# Patient Record
Sex: Female | Born: 1965 | Race: Black or African American | Hispanic: No | Marital: Single | Smoking: Never smoker
Health system: Southern US, Community
[De-identification: ages and names within clinical notes are randomized; demographics above are authoritative.]

## PROBLEM LIST (undated history)

## (undated) DIAGNOSIS — M797 Fibromyalgia: Secondary | ICD-10-CM

## (undated) DIAGNOSIS — K649 Unspecified hemorrhoids: Secondary | ICD-10-CM

## (undated) DIAGNOSIS — M1990 Unspecified osteoarthritis, unspecified site: Secondary | ICD-10-CM

## (undated) DIAGNOSIS — D649 Anemia, unspecified: Secondary | ICD-10-CM

## (undated) DIAGNOSIS — E669 Obesity, unspecified: Secondary | ICD-10-CM

## (undated) DIAGNOSIS — F32A Depression, unspecified: Secondary | ICD-10-CM

## (undated) DIAGNOSIS — K760 Fatty (change of) liver, not elsewhere classified: Secondary | ICD-10-CM

## (undated) DIAGNOSIS — G43009 Migraine without aura, not intractable, without status migrainosus: Secondary | ICD-10-CM

## (undated) DIAGNOSIS — F410 Panic disorder [episodic paroxysmal anxiety] without agoraphobia: Secondary | ICD-10-CM

## (undated) DIAGNOSIS — I8390 Asymptomatic varicose veins of unspecified lower extremity: Secondary | ICD-10-CM

## (undated) DIAGNOSIS — M7711 Lateral epicondylitis, right elbow: Secondary | ICD-10-CM

## (undated) DIAGNOSIS — J309 Allergic rhinitis, unspecified: Secondary | ICD-10-CM

## (undated) DIAGNOSIS — F419 Anxiety disorder, unspecified: Secondary | ICD-10-CM

## (undated) DIAGNOSIS — I82409 Acute embolism and thrombosis of unspecified deep veins of unspecified lower extremity: Secondary | ICD-10-CM

## (undated) DIAGNOSIS — K219 Gastro-esophageal reflux disease without esophagitis: Secondary | ICD-10-CM

## (undated) DIAGNOSIS — F329 Major depressive disorder, single episode, unspecified: Secondary | ICD-10-CM

## (undated) HISTORY — PX: OTHER SURGICAL HISTORY: SHX169

## (undated) HISTORY — PX: ABDOMINAL HYSTERECTOMY: SHX81

## (undated) HISTORY — DX: Anemia, unspecified: D64.9

## (undated) HISTORY — DX: Unspecified osteoarthritis, unspecified site: M19.90

## (undated) HISTORY — DX: Unspecified hemorrhoids: K64.9

## (undated) HISTORY — DX: Lateral epicondylitis, right elbow: M77.11

## (undated) HISTORY — PX: BREAST EXCISIONAL BIOPSY: SUR124

## (undated) HISTORY — DX: Obesity, unspecified: E66.9

## (undated) HISTORY — DX: Migraine without aura, not intractable, without status migrainosus: G43.009

## (undated) HISTORY — DX: Allergic rhinitis, unspecified: J30.9

## (undated) HISTORY — DX: Fatty (change of) liver, not elsewhere classified: K76.0

## (undated) HISTORY — DX: Acute embolism and thrombosis of unspecified deep veins of unspecified lower extremity: I82.409

## (undated) HISTORY — DX: Asymptomatic varicose veins of unspecified lower extremity: I83.90

## (undated) HISTORY — DX: Panic disorder (episodic paroxysmal anxiety): F41.0

---

## 2004-12-22 ENCOUNTER — Other Ambulatory Visit: Admission: RE | Admit: 2004-12-22 | Discharge: 2004-12-22 | Payer: Self-pay | Admitting: Obstetrics and Gynecology

## 2005-09-19 ENCOUNTER — Inpatient Hospital Stay (HOSPITAL_COMMUNITY): Admission: RE | Admit: 2005-09-19 | Discharge: 2005-09-21 | Payer: Self-pay | Admitting: Obstetrics and Gynecology

## 2005-09-19 ENCOUNTER — Encounter (INDEPENDENT_AMBULATORY_CARE_PROVIDER_SITE_OTHER): Payer: Self-pay | Admitting: Specialist

## 2007-05-14 ENCOUNTER — Other Ambulatory Visit: Admission: RE | Admit: 2007-05-14 | Discharge: 2007-05-14 | Payer: Self-pay | Admitting: Internal Medicine

## 2007-08-20 ENCOUNTER — Ambulatory Visit (HOSPITAL_COMMUNITY): Admission: RE | Admit: 2007-08-20 | Discharge: 2007-08-20 | Payer: Self-pay | Admitting: *Deleted

## 2008-08-26 ENCOUNTER — Encounter: Admission: RE | Admit: 2008-08-26 | Discharge: 2008-11-24 | Payer: Self-pay | Admitting: Unknown Physician Specialty

## 2008-10-13 ENCOUNTER — Ambulatory Visit (HOSPITAL_COMMUNITY): Admission: RE | Admit: 2008-10-13 | Discharge: 2008-10-13 | Payer: Self-pay | Admitting: Sports Medicine

## 2008-12-09 ENCOUNTER — Ambulatory Visit (HOSPITAL_COMMUNITY): Admission: RE | Admit: 2008-12-09 | Discharge: 2008-12-09 | Payer: Self-pay | Admitting: *Deleted

## 2009-11-09 ENCOUNTER — Encounter (INDEPENDENT_AMBULATORY_CARE_PROVIDER_SITE_OTHER): Payer: Self-pay | Admitting: Obstetrics and Gynecology

## 2009-11-09 ENCOUNTER — Ambulatory Visit (HOSPITAL_COMMUNITY): Admission: RE | Admit: 2009-11-09 | Discharge: 2009-11-10 | Payer: Self-pay | Admitting: Obstetrics and Gynecology

## 2010-07-25 ENCOUNTER — Encounter: Payer: Self-pay | Admitting: Internal Medicine

## 2010-09-21 LAB — HCG, SERUM, QUALITATIVE: Preg, Serum: NEGATIVE

## 2010-09-21 LAB — COMPREHENSIVE METABOLIC PANEL
Albumin: 4 g/dL (ref 3.5–5.2)
Alkaline Phosphatase: 50 U/L (ref 39–117)
BUN: 13 mg/dL (ref 6–23)
Chloride: 107 mEq/L (ref 96–112)
Creatinine, Ser: 1.03 mg/dL (ref 0.4–1.2)
Glucose, Bld: 78 mg/dL (ref 70–99)
Potassium: 4 mEq/L (ref 3.5–5.1)
Total Bilirubin: 0.4 mg/dL (ref 0.3–1.2)

## 2010-09-21 LAB — CBC
HCT: 37.5 % (ref 36.0–46.0)
Hemoglobin: 13 g/dL (ref 12.0–15.0)
MCV: 94.2 fL (ref 78.0–100.0)
Platelets: 163 10*3/uL (ref 150–400)
Platelets: 239 10*3/uL (ref 150–400)
RBC: 2.8 MIL/uL — ABNORMAL LOW (ref 3.87–5.11)
RDW: 14.8 % (ref 11.5–15.5)
WBC: 12.7 10*3/uL — ABNORMAL HIGH (ref 4.0–10.5)
WBC: 6.4 10*3/uL (ref 4.0–10.5)

## 2010-11-16 NOTE — Op Note (Signed)
Jennifer Henry, Jennifer Henry             ACCOUNT NO.:  [id number]   MEDICAL RECORD NO.:  [id number]          PATIENT TYPE:  AMB   LOCATION:  ENDO                         FACILITY:  [facility]   PHYSICIAN:  Georgiana Spinner, M.D.    DATE OF BIRTH:  [birth year]   DATE OF PROCEDURE:  08/20/2007  DATE OF DISCHARGE:                               OPERATIVE REPORT   PROCEDURE:  Upper endoscopy.   ENDOSCOPIST:  Georgiana Spinner, M.D.   INDICATIONS:  GERD.   ANESTHESIA:  Fentanyl 60 mcg, Versed 6 mg.   PROCEDURE:  With the patient mildly sedated in the left lateral  decubitus position, the Pentax videoscopic endoscope was inserted in the  mouth and passed under direct vision through the esophagus, which  appeared normal, into the stomach.  Fundus, body, antrum, duodenal bulb  and second portion of duodenum all appeared normal.  From this point,  the endoscope was slowly withdrawn, taking circumferential views of  duodenal mucosa until the endoscope had been pulled back into stomach  and placed in retroflexion to view the stomach from below.  The  endoscope was straightened and withdrawn, taking circumferential views  of the remaining gastric and esophageal mucosa.  The patient's vital  signs and pulse oximetry remained stable.  The patient tolerated the  procedure well without apparent complications.   FINDINGS:  Negative examination.   PLAN:  Have patient follow up with me as an outpatient to discuss her  reflux symptomatology further.           ______________________________  Georgiana Spinner, M.D.     GMO/MEDQ  D:  08/20/2007  T:  08/21/2007  Job:  [id number]

## 2010-11-16 NOTE — Op Note (Signed)
Jennifer Henry, KARPEL             ACCOUNT NO.:  [id number]   MEDICAL RECORD NO.:  [id number]          PATIENT TYPE:  AMB   LOCATION:  ENDO                         FACILITY:  [facility]   PHYSICIAN:  Georgiana Spinner, M.D.    DATE OF BIRTH:  05/12/1966   DATE OF PROCEDURE:  DATE OF DISCHARGE:                               OPERATIVE REPORT   PROCEDURE:  Colonoscopy.   INDICATIONS:  Rectal bleeding.   ANESTHESIA:  Fentanyl 100 mcg, Versed 7 mg.   PROCEDURE:  With the patient mildly sedated in the left lateral  decubitus position, the Pentax videoscopic pediatric colonoscope was  inserted in the rectum and passed under direct vision with pressure  applied.  The patient turned to her back and we were finally able to see  the cecum, identified by ileocecal valve and appendiceal orifice, both  of which were photographed.  It was a very tortuous colon.  From this  point, the colonoscope was slowly withdrawn taking circumferential views  of the colonic mucosa stopping only in the rectum which appeared normal  on direct and showed hemorrhoids on retroflexed view.  The endoscope was  straightened and withdrawn.  The patient's vital signs and pulse  oximeter remained stable.  The patient tolerated the procedure well  without apparent complications.   FINDINGS:  Negative rectal examination and perineal inspection.  Internal hemorrhoids were noted.  Otherwise, an unremarkable  colonoscopic examination to the cecum.   PLAN:  Have patient to follow-up with me on as-needed basis.           ______________________________  Georgiana Spinner, M.D.     GMO/MEDQ  D:  12/09/2008  T:  12/09/2008  Job:  [id number]

## 2010-11-19 NOTE — H&P (Signed)
Jennifer Henry, Jennifer Henry             ACCOUNT NO.:  [id number]   MEDICAL RECORD NO.:  [id number]          PATIENT TYPE:  INP   LOCATION:  NA                            FACILITY:  [facility]   PHYSICIAN:  Guy Sandifer. Henderson Cloud, M.D. DATE OF BIRTH:  1966/05/22   DATE OF ADMISSION:  DATE OF DISCHARGE:                                HISTORY & PHYSICAL   DATE OF ADMISSION:  Patient scheduled for admission on September 19, 2005.   CHIEF COMPLAINT:  Uterine fibroids.   HISTORY OF PRESENT ILLNESS:  This patient is a 45 year old single African-  American female G1, P1 who has increasingly heavy menstrual flows, changing  a large pad every two to three hours for two days out of the month.  She has  known uterine leiomyomata.  Ultrasound on July 18, 2005 reveals growth of  her leiomyomata with the largest one measuring 7 cm followed by several  others ranging from 4.7 to 1.3 cm.  After discussion of the options she is  being admitted for uterine myomectomy.  Potential risks and complications as  well as potential impacts upon fertility and labor and delivery have been  discussed preoperatively.   PAST MEDICAL HISTORY:  Negative.   PAST SURGICAL HISTORY:  Benign right breast biopsy.   FAMILY HISTORY:  Diabetes in mother, maternal grandmother, father.  Brain  cancer in sister.  Chronic hypertension in mother, father and both sets of  grandparents.  Heart disease in paternal grandmother, congestive heart  failure in maternal grandmother, seizure disorder in sister.   MEDICATIONS:  1.  Lo-Estrin once a day.  2.  Multivitamin once a day.   ALLERGIES:  No known drug allergies.   SOCIAL HISTORY:  Alcohol on a social basis. Denies tobacco or drug abuse.   REVIEW OF SYSTEMS:  NEUROLOGICAL:  Denies headache.  CORONARY:  Denies chest  pain.  PULMONARY:  Denies shortness of breath. GASTROINTESTINAL:  Denies  recent changes in bowel habits.   PHYSICAL EXAMINATION:  VITAL SIGNS:  Height 5 feet 6 inches, weight  163  pounds.  Blood pressure 110/70.  HEENT:  Without thyromegaly.  LUNGS:  Clear to auscultation.  HEART:  Regular rate and rhythm.  BACK:  Without costovertebral angle tenderness.  BREASTS:  Without masses, retraction, discharge.  ABDOMEN:  Soft, nontender without palpable masses.  PELVIC:  Vulva, vagina, cervix without lesion.  Uterus has a 6 to 8 cm  fibroid mass effect, irregular in contour.  Adnexal examination compromised.  EXTREMITIES:  Grossly within normal limits.  NEUROLOGICAL:  Grossly within normal limits.   ASSESSMENT:  Enlarging uterine leiomyomata.   PLAN:  Uterine myomectomy.      Guy Sandifer Henderson Cloud, M.D.  Electronically Signed     JET/MEDQ  D:  09/16/2005  T:  09/16/2005  Job:  [id number]

## 2010-11-19 NOTE — Discharge Summary (Signed)
Jennifer Henry, Jennifer Henry             ACCOUNT NO.:  [id number]   MEDICAL RECORD NO.:  [id number]          PATIENT TYPE:  INP   LOCATION:  9320                          FACILITY:  [facility]   PHYSICIAN:  Guy Sandifer. Henderson Cloud, M.D. DATE OF BIRTH:  [birth year]   DATE OF ADMISSION:  09/19/2005  DATE OF DISCHARGE:  09/21/2005                                 DISCHARGE SUMMARY   ADMITTING DIAGNOSES:  Uterine leiomyomata.   DISCHARGE DIAGNOSIS:  Uterine leiomyomata.   PROCEDURE:  On September 19, 2005, a uterine myomectomy.   REASON FOR ADMISSION:  This patient is a 45 year old single African-American  female, G1, P1 with increasing heavy menstrual flows and an enlarging  uterine leiomyomata.  Details are dictated in history and physical.  She is  admitted for surgical management.   HOSPITAL COURSE:  The patient is taken to the operating room and undergoes  the above procedure with an estimated blood loss of 400 mL.  On the evening  of surgery, she has some mild nausea, but has stable vital signs and is  afebrile.  Urine output is clear.  On the first postoperative day, she has  not yet passed flatus, but is ambulating.  She has some right shoulder pain  from some intra-abdominal gas.  Vital signs are stable.  She is afebrile and  hemoglobin is 9.5.  On the discharge,, she is passing flatus, tolerating a  regular diet and ambulating well.  Pathology is pending.   CONDITION ON DISCHARGE:  Good.   DIET:  Regular, as tolerated.   ACTIVITY:  No lifting, no operation of automobiles, no vaginal entry and she  is to call the office for problems including, but not limited to,  temperature of 101 degrees, heavy vaginal bleeding, persistent nausea or  vomiting, or increasing pain.   MEDICATIONS:  1.  Percocet 5/325 mg #40, 1-2 p.o. q.6h. p.r.n.  2.  Ibuprofen 800 mg q.8h. p.r.n.  3.  Chromagen #30, 1 p.o. daily and 1 refill.  4.  Multivitamin daily.   FOLLOWUP:  In the office in 2 weeks.     Guy Sandifer  Henderson Cloud, M.D.  Electronically Signed    JET/MEDQ  D:  09/21/2005  T:  09/21/2005  Job:  [id number]

## 2010-11-19 NOTE — Op Note (Signed)
NAMEALIS, SAWCHUK             ACCOUNT NO.:  [id number]   MEDICAL RECORD NO.:  [id number]          PATIENT TYPE:  INP   LOCATION:  9399                          FACILITY:  [facility]   PHYSICIAN:  Guy Sandifer. Henderson Cloud, M.D. DATE OF BIRTH:  January 11, 1966   DATE OF PROCEDURE:  09/19/2005  DATE OF DISCHARGE:                                 OPERATIVE REPORT   PREOPERATIVE DIAGNOSIS:  Uterine leiomyomata.   POSTOPERATIVE DIAGNOSIS:  Uterine leiomyomata.   PROCEDURE:  Uterine myomectomy.   SURGEON:  Harold Hedge, M.D.   ASSISTANT:  Zelphia Cairo, M.D.   ANESTHESIA:  General endotracheal intubation.   SPECIMENS:  Uterine leiomyomata.   ESTIMATED BLOOD LOSS:  400 mL.   INDICATIONS:  The patient is a 45 year old single African-American female,  gravida 1, para 1 with increasingly heavy menstrual flows and enlarging  uterine leiomyomata. Details are dictated in history and physical. After  discussing options of management, uterine myomectomy is discussed. Potential  risks and complications have been reviewed preoperatively including but not  limited to infection, bowel, bladder, ureteral damage, bleeding requiring  transfusion of blood products with possible transfusion reaction, HIV,  hepatitis acquisition, DVT, PE, pneumonia. The possibility of hysterectomy,  infertility secondary to tubal obstruction and/or tubal scarring and the  necessity for cesarean section for delivery after myomectomy as well as the  recurrence of uterine leiomyomata has also been discussed out. All questions  were answered, consent signed on the chart.   UTERINE FINDINGS:  Tubes and ovaries normal bilaterally. Uterus has a  approximately 8 cm fibroid in the lower anterior uterine segment. There is  also approximately 4 cm fibroid in the right fundus as well as several other  smaller fibroids.   PROCEDURE:  The patient is taken to operating room where she is identified,  placed in dorsosupine position and  general anesthesia is induced via  endotracheal intubation. She is then placed in dorsal lithotomy position  where she is prepped abdominally and vaginally. Foley catheter was placed in  the bladder as a drain and she is draped in sterile fashion. Pfannenstiel  incision was made and dissection is carried out in layers to the peritoneum.  Peritoneum was incised, extended superiorly and inferiorly. Lenox Ahr retractor was placed. The bowel was packed away and the upper  blade was placed. Bladder was also retracted with a bladder blade. The  various fibroids were identified. The serosa was incised with the cautery  and they are freed with sharp and blunt dissection. The defects were closed  with 0 Monocryl pops. The fibroid in the right fundus is in proximity to the  course of the fallopian tube although not directly underneath it. The larger  fibroid in the lower uterine segment occupies essentially the entire depth  of the uterine wall. After removing all the fibroids, the defects were  closed. Good hemostasis was noted. Copious irrigation was carried out and  good hemostasis was again noted and the cavity is clean. The packs and  superior retraction blade is removed. Intercede was then laid over top of  the sites of myomectomies. O'Connor-O'Sullivan retractor  was then removed.  The anterior peritoneum was closed running fashion with 0 Monocryl suture  which was also used to reapproximate the pyramidalis muscle in the midline.  Anterior rectus fascia is closed in running fashion with 0 PDS suture and  the skin was closed with clips. All sponge, instrument and needle counts  were correct and the patient is transferred to recovery room in stable  condition.      Guy Sandifer Henderson Cloud, M.D.  Electronically Signed     JET/MEDQ  D:  09/19/2005  T:  09/20/2005  Job:  [id number]

## 2011-05-23 DIAGNOSIS — L669 Cicatricial alopecia, unspecified: Secondary | ICD-10-CM | POA: Insufficient documentation

## 2012-03-20 DIAGNOSIS — IMO0001 Reserved for inherently not codable concepts without codable children: Secondary | ICD-10-CM | POA: Insufficient documentation

## 2012-03-20 DIAGNOSIS — G4710 Hypersomnia, unspecified: Secondary | ICD-10-CM | POA: Insufficient documentation

## 2012-03-20 DIAGNOSIS — R209 Unspecified disturbances of skin sensation: Secondary | ICD-10-CM | POA: Insufficient documentation

## 2012-03-20 DIAGNOSIS — R6889 Other general symptoms and signs: Secondary | ICD-10-CM | POA: Insufficient documentation

## 2012-03-26 ENCOUNTER — Other Ambulatory Visit: Payer: Self-pay | Admitting: Obstetrics and Gynecology

## 2012-03-26 DIAGNOSIS — R928 Other abnormal and inconclusive findings on diagnostic imaging of breast: Secondary | ICD-10-CM

## 2012-03-27 ENCOUNTER — Ambulatory Visit
Admission: RE | Admit: 2012-03-27 | Discharge: 2012-03-27 | Disposition: A | Discharge status: Home / Self Care | Payer: Managed Care, Other (non HMO) | Source: Ambulatory Visit | Attending: Obstetrics and Gynecology | Admitting: Obstetrics and Gynecology

## 2012-03-27 ENCOUNTER — Other Ambulatory Visit: Payer: Self-pay

## 2012-03-27 DIAGNOSIS — R928 Other abnormal and inconclusive findings on diagnostic imaging of breast: Secondary | ICD-10-CM

## 2012-04-05 ENCOUNTER — Other Ambulatory Visit: Payer: Self-pay | Admitting: Gastroenterology

## 2012-04-05 DIAGNOSIS — R1032 Left lower quadrant pain: Secondary | ICD-10-CM

## 2012-04-06 ENCOUNTER — Ambulatory Visit
Admission: RE | Admit: 2012-04-06 | Discharge: 2012-04-06 | Disposition: A | Discharge status: Home / Self Care | Payer: Managed Care, Other (non HMO) | Source: Ambulatory Visit | Attending: Gastroenterology | Admitting: Gastroenterology

## 2012-04-06 DIAGNOSIS — R1032 Left lower quadrant pain: Secondary | ICD-10-CM

## 2012-04-06 MED ORDER — IOHEXOL 300 MG/ML  SOLN
100.0000 mL | Freq: Once | INTRAMUSCULAR | Status: AC | PRN
  Administered 2012-04-06: 100 mL via INTRAVENOUS

## 2012-04-13 ENCOUNTER — Other Ambulatory Visit: Payer: Managed Care, Other (non HMO)

## 2012-04-23 ENCOUNTER — Encounter: Payer: Self-pay | Admitting: *Deleted

## 2012-04-23 ENCOUNTER — Encounter: Payer: Managed Care, Other (non HMO) | Attending: Internal Medicine | Admitting: *Deleted

## 2012-04-23 DIAGNOSIS — E669 Obesity, unspecified: Secondary | ICD-10-CM | POA: Insufficient documentation

## 2012-04-23 DIAGNOSIS — Z713 Dietary counseling and surveillance: Secondary | ICD-10-CM | POA: Insufficient documentation

## 2012-04-23 DIAGNOSIS — E785 Hyperlipidemia, unspecified: Secondary | ICD-10-CM | POA: Insufficient documentation

## 2012-04-23 NOTE — Progress Notes (Signed)
  Medical Nutrition Therapy:  Appt start time: 0730 end time:  0830.  Assessment:  Primary concerns today: patient here for obesity. She states she has fibromyalgia and cannot exercise at gym. 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 She lives alone prepares her own meals or eats at [employer] or [employer] usually.    MEDICATIONS: see list   DIETARY INTAKE:  Usual eating pattern includes 3 meals and 3 snacks per day.  Everyday foods include good variety of all food groups except milk due to lactose intolerance.  Avoided foods include caffeine containing beverages due to GERD  24-hr recall:  B ( AM): flavored apple cinnamon oatmeal, [employer] white chocolate mocha coffee twice a week, (cut back from daily) Snk ( AM): fresh fruit and water after coffee is gone  L ( PM): bring from home; [place] wrap OR salad with meat added with balsamic vinaigrette dressing OR eat out with chopped Cobb salad or full baked potato Snk ( PM): occasionally M&M's with peanuts from vending machine OR Nacho chips D ( PM): more fish lately, vegetables x 2-3, occasionally starch OR  [employer]; Strips with waffle fries or [employer] Cobb salad,  Snk ( PM): popsicle Beverages: water or cranberry juice, Starbuck's coffee  Usual physical activity: none due to fibromyalgia  Estimated energy needs: 1600 calories 180 g carbohydrates 120 g protein 44 g fat  Progress Towards Goal(s):  In progress.   Nutritional Diagnosis:  NI-1.5 Excessive energy intake As related to activity level.  As evidenced by BMI of 31.1.    Intervention:  Nutrition counseling for weight loss and hyperlipidemia initated. Taught her Carb Counting as method for increasing awareness of calorie intake, reading food labels, and benefits of increased activity specifically in the water due to fibromyalgia. Demonstrated Calorie [employer] APP for more nutrition information on her  phone.  Plan: Aim for 3 Carb Choices (45 grams) per meal +/- 1 either way Aim for 0-1 Carb Choices per snack if hungry Continue choosing lean meats and more fish and chicken over red meat choices Consider reading food labels for total carbohydrate and fat grams of foods Consider water exercises for relief of fibromyalgia pain and to increase activity level.  Handouts given during visit include: Carb Counting and Food Label handouts Meal Plan Card Handout for [facility]  Monitoring/Evaluation:  Dietary intake, exercise, reading food labels, and body weight prn.

## 2012-04-23 NOTE — Patient Instructions (Signed)
Plan: Aim for 3 Carb Choices (45 grams) per meal +/- 1 either way Aim for 0-1 Carb Choices per snack if hungry Continue choosing lean meats and more fish and chicken over red meat choices Consider reading food labels for total carbohydrate and fat grams of foods Consider water exercises for relief of fibromyalgia pain and to increase activity level.

## 2012-08-09 ENCOUNTER — Emergency Department (HOSPITAL_BASED_OUTPATIENT_CLINIC_OR_DEPARTMENT_OTHER)
Admission: EM | Admit: 2012-08-09 | Discharge: 2012-08-09 | Disposition: A | Discharge status: Home / Self Care | Payer: Managed Care, Other (non HMO) | Attending: Emergency Medicine | Admitting: Emergency Medicine

## 2012-08-09 ENCOUNTER — Encounter (HOSPITAL_BASED_OUTPATIENT_CLINIC_OR_DEPARTMENT_OTHER): Payer: Self-pay | Admitting: Family Medicine

## 2012-08-09 ENCOUNTER — Other Ambulatory Visit: Payer: Self-pay

## 2012-08-09 ENCOUNTER — Emergency Department (HOSPITAL_BASED_OUTPATIENT_CLINIC_OR_DEPARTMENT_OTHER): Payer: Managed Care, Other (non HMO)

## 2012-08-09 DIAGNOSIS — F411 Generalized anxiety disorder: Secondary | ICD-10-CM | POA: Insufficient documentation

## 2012-08-09 DIAGNOSIS — Z79899 Other long term (current) drug therapy: Secondary | ICD-10-CM | POA: Insufficient documentation

## 2012-08-09 DIAGNOSIS — M79609 Pain in unspecified limb: Secondary | ICD-10-CM | POA: Insufficient documentation

## 2012-08-09 DIAGNOSIS — IMO0001 Reserved for inherently not codable concepts without codable children: Secondary | ICD-10-CM | POA: Insufficient documentation

## 2012-08-09 DIAGNOSIS — R5383 Other fatigue: Secondary | ICD-10-CM | POA: Insufficient documentation

## 2012-08-09 DIAGNOSIS — R5381 Other malaise: Secondary | ICD-10-CM | POA: Insufficient documentation

## 2012-08-09 DIAGNOSIS — R079 Chest pain, unspecified: Secondary | ICD-10-CM | POA: Insufficient documentation

## 2012-08-09 DIAGNOSIS — F329 Major depressive disorder, single episode, unspecified: Secondary | ICD-10-CM | POA: Insufficient documentation

## 2012-08-09 DIAGNOSIS — F3289 Other specified depressive episodes: Secondary | ICD-10-CM | POA: Insufficient documentation

## 2012-08-09 DIAGNOSIS — Z8719 Personal history of other diseases of the digestive system: Secondary | ICD-10-CM | POA: Insufficient documentation

## 2012-08-09 HISTORY — DX: Major depressive disorder, single episode, unspecified: F32.9

## 2012-08-09 HISTORY — DX: Gastro-esophageal reflux disease without esophagitis: K21.9

## 2012-08-09 HISTORY — DX: Anxiety disorder, unspecified: F41.9

## 2012-08-09 HISTORY — DX: Depression, unspecified: F32.A

## 2012-08-09 HISTORY — DX: Fibromyalgia: M79.7

## 2012-08-09 LAB — BASIC METABOLIC PANEL
CO2: 26 mEq/L (ref 19–32)
Calcium: 9.2 mg/dL (ref 8.4–10.5)
Chloride: 106 mEq/L (ref 96–112)
Creatinine, Ser: 1 mg/dL (ref 0.50–1.10)
Glucose, Bld: 93 mg/dL (ref 70–99)
Sodium: 140 mEq/L (ref 135–145)

## 2012-08-09 LAB — CBC WITH DIFFERENTIAL/PLATELET
Basophils Absolute: 0 10*3/uL (ref 0.0–0.1)
Eosinophils Relative: 1 % (ref 0–5)
HCT: 35.4 % — ABNORMAL LOW (ref 36.0–46.0)
Lymphocytes Relative: 37 % (ref 12–46)
Lymphs Abs: 2.4 10*3/uL (ref 0.7–4.0)
MCV: 91.5 fL (ref 78.0–100.0)
Monocytes Absolute: 0.4 10*3/uL (ref 0.1–1.0)
Neutro Abs: 3.6 10*3/uL (ref 1.7–7.7)
RBC: 3.87 MIL/uL (ref 3.87–5.11)
WBC: 6.5 10*3/uL (ref 4.0–10.5)

## 2012-08-09 LAB — GLUCOSE, CAPILLARY: Glucose-Capillary: 90 mg/dL (ref 70–99)

## 2012-08-09 LAB — URINALYSIS, ROUTINE W REFLEX MICROSCOPIC
Glucose, UA: NEGATIVE mg/dL
Hgb urine dipstick: NEGATIVE
Leukocytes, UA: NEGATIVE
Protein, ur: NEGATIVE mg/dL
pH: 6 (ref 5.0–8.0)

## 2012-08-09 NOTE — ED Notes (Addendum)
Pt c/o shortness of breath for "weeks" constant. Pt sts she feels "like I'm in a daze", also c/o left arm pain and chest "tightness". Pt sts she has neurologist Dr. Anne Hahn that treats her fibromyalgia. Pt sts she just feels tired. Pt denies injury to arm, denies n/v/d, denies cough.

## 2012-08-09 NOTE — ED Provider Notes (Signed)
History     CSN: [id number]  Arrival date & time 08/09/12  1034   First MD Initiated Contact with Patient 08/09/12 1123      Chief Complaint  Patient presents with  . Shortness of Breath    (Consider location/radiation/quality/duration/timing/severity/associated sxs/prior treatment) HPI Pt reports she has had generalized fatigue off and on for two years since she was diagnosed with fibromyalgia. She had an episode of general weakness today while at work associated with sharp pain in L arm, mild aching chest pain and SOB worse with walking. She denies any fever, vomiting, diarrhea, dizziness or loss of consciousness. She has seen her doctor several times in the past for same. States thyroid function was checked less than a month ago and normal.   Past Medical History  Diagnosis Date  . GERD (gastroesophageal reflux disease)   . Fibromyalgia   . Anxiety   . Depression     Past Surgical History  Procedure Date  . Abdominal hysterectomy     No family history on file.  History  Substance Use Topics  . Smoking status: Never Smoker   . Smokeless tobacco: Never Used  . Alcohol Use: No    OB History    Grav Para Term Preterm Abortions TAB SAB Ect Mult Living                  Review of Systems All other systems reviewed and are negative except as noted in HPI.   Allergies  Penicillins  Home Medications   Current Outpatient Rx  Name  Route  Sig  Dispense  Refill  . XANAX PO   Oral   Take by mouth.         Marland Kitchen LEXAPRO PO   Oral   Take by mouth.         Marland Kitchen GABAPENTIN PO   Oral   Take by mouth.         Marland Kitchen LIDOCAINE 5 % EX PTCH   Transdermal   Place 1 patch onto the skin daily. Remove & Discard patch within 12 hours or as directed by MD           BP 128/93  Pulse 67  Temp 98.2 F (36.8 C) (Oral)  Resp 16  SpO2 98%  Physical Exam  Nursing note and vitals reviewed. Constitutional: She is oriented to person, place, and time. She appears  well-developed and well-nourished.  HENT:  Head: Normocephalic and atraumatic.  Eyes: EOM are normal. Pupils are equal, round, and reactive to light.  Neck: Normal range of motion. Neck supple.  Cardiovascular: Normal rate, normal heart sounds and intact distal pulses.   Pulmonary/Chest: Effort normal and breath sounds normal.  Abdominal: Bowel sounds are normal. She exhibits no distension. There is no tenderness.  Musculoskeletal: Normal range of motion. She exhibits no edema and no tenderness.  Neurological: She is alert and oriented to person, place, and time. She has normal strength. She displays normal reflexes. No cranial nerve deficit or sensory deficit. Coordination normal.  Skin: Skin is warm and dry. No rash noted.  Psychiatric: She has a normal mood and affect.    ED Course  Procedures (including critical care time)  Labs Reviewed  CBC WITH DIFFERENTIAL - Abnormal; Notable for the following:    HCT 35.4 (*)     All other components within normal limits  BASIC METABOLIC PANEL - Abnormal; Notable for the following:    GFR calc non Af Amer 67 (*)  GFR calc Af Amer 78 (*)     All other components within normal limits  URINALYSIS, ROUTINE W REFLEX MICROSCOPIC - Abnormal; Notable for the following:    APPearance CLOUDY (*)     All other components within normal limits  GLUCOSE, CAPILLARY   Dg Chest 2 View  08/09/2012  *RADIOLOGY REPORT*  Clinical Data: Shortness of breath and chest pain  CHEST - 2 VIEW  Comparison: None.  Findings:  Lungs clear.  Heart is upper normal in size with normal pulmonary vascularity.  No adenopathy.  No bone lesions.  No pneumothorax.  IMPRESSION: No edema or consolidation.   Original Report Authenticated By: Bretta Bang, M.D.      No diagnosis found.    MDM   Date: 08/09/2012  Rate: 61  Rhythm: normal sinus rhythm  QRS Axis: normal  Intervals: normal  ST/T Wave abnormalities: normal  Conduction Disutrbances: none  Narrative  Interpretation: unremarkable  Labs and imaging in the ED is normal. Exam unremarkable. Pt with chronic fatigue of unclear etiology but does not appear to be any acute process. Advised PCP followup.           Jasiyah Paulding B. Bernette Mayers, MD 08/09/12 1314

## 2012-11-12 ENCOUNTER — Emergency Department (HOSPITAL_BASED_OUTPATIENT_CLINIC_OR_DEPARTMENT_OTHER): Payer: Managed Care, Other (non HMO)

## 2012-11-12 ENCOUNTER — Emergency Department (HOSPITAL_BASED_OUTPATIENT_CLINIC_OR_DEPARTMENT_OTHER)
Admission: EM | Admit: 2012-11-12 | Discharge: 2012-11-12 | Disposition: A | Discharge status: Home / Self Care | Payer: Managed Care, Other (non HMO) | Attending: Emergency Medicine | Admitting: Emergency Medicine

## 2012-11-12 ENCOUNTER — Encounter (HOSPITAL_BASED_OUTPATIENT_CLINIC_OR_DEPARTMENT_OTHER): Payer: Self-pay | Admitting: *Deleted

## 2012-11-12 DIAGNOSIS — Z79899 Other long term (current) drug therapy: Secondary | ICD-10-CM | POA: Insufficient documentation

## 2012-11-12 DIAGNOSIS — E669 Obesity, unspecified: Secondary | ICD-10-CM | POA: Insufficient documentation

## 2012-11-12 DIAGNOSIS — Y939 Activity, unspecified: Secondary | ICD-10-CM | POA: Insufficient documentation

## 2012-11-12 DIAGNOSIS — Z8739 Personal history of other diseases of the musculoskeletal system and connective tissue: Secondary | ICD-10-CM | POA: Insufficient documentation

## 2012-11-12 DIAGNOSIS — Z8719 Personal history of other diseases of the digestive system: Secondary | ICD-10-CM | POA: Insufficient documentation

## 2012-11-12 DIAGNOSIS — R42 Dizziness and giddiness: Secondary | ICD-10-CM | POA: Insufficient documentation

## 2012-11-12 DIAGNOSIS — W19XXXA Unspecified fall, initial encounter: Secondary | ICD-10-CM

## 2012-11-12 DIAGNOSIS — Y929 Unspecified place or not applicable: Secondary | ICD-10-CM | POA: Insufficient documentation

## 2012-11-12 DIAGNOSIS — Z880 Allergy status to penicillin: Secondary | ICD-10-CM | POA: Insufficient documentation

## 2012-11-12 DIAGNOSIS — Z3202 Encounter for pregnancy test, result negative: Secondary | ICD-10-CM | POA: Insufficient documentation

## 2012-11-12 DIAGNOSIS — W1809XA Striking against other object with subsequent fall, initial encounter: Secondary | ICD-10-CM | POA: Insufficient documentation

## 2012-11-12 DIAGNOSIS — Z8659 Personal history of other mental and behavioral disorders: Secondary | ICD-10-CM | POA: Insufficient documentation

## 2012-11-12 DIAGNOSIS — S0990XA Unspecified injury of head, initial encounter: Secondary | ICD-10-CM | POA: Insufficient documentation

## 2012-11-12 LAB — URINALYSIS, ROUTINE W REFLEX MICROSCOPIC
Leukocytes, UA: NEGATIVE
Nitrite: NEGATIVE
Specific Gravity, Urine: 1.008 (ref 1.005–1.030)
Urobilinogen, UA: 0.2 mg/dL (ref 0.0–1.0)
pH: 7.5 (ref 5.0–8.0)

## 2012-11-12 LAB — PREGNANCY, URINE: Preg Test, Ur: NEGATIVE

## 2012-11-12 MED ORDER — IBUPROFEN 800 MG PO TABS
800.0000 mg | ORAL_TABLET | Freq: Once | ORAL | Status: AC
  Administered 2012-11-12: 800 mg via ORAL
  Filled 2012-11-12: qty 1

## 2012-11-12 MED ORDER — HYDROCODONE-ACETAMINOPHEN 5-325 MG PO TABS
2.0000 | ORAL_TABLET | Freq: Once | ORAL | Status: DC
  Filled 2012-11-12 (×2): qty 2

## 2012-11-12 NOTE — ED Notes (Signed)
Pt given water and able to drink now with no distress noted. Will reassess in 15 minutes.

## 2012-11-12 NOTE — ED Notes (Signed)
Fell and hit her head on a wooden bed post this am. Loc. Headache. Took Aleve. Went to work and had to leave work due to pain in the right side of her head.

## 2012-11-12 NOTE — ED Provider Notes (Signed)
History  This chart was scribed for Glynn Octave, MD by Shari Heritage, ED Scribe. The patient was seen in room MH03/MH03. Patient's care was started at 1808.   CSN: [id number]  Arrival date & time 11/12/12  1754   First MD Initiated Contact with Patient 11/12/12 1808      Chief Complaint  Patient presents with  . Fall    The history is provided by the patient. No language interpreter was used.    HPI Comments: Jennifer Henry is a 47 y.o. female who presents to the Emergency Department complaining of fall and resultant head injury onset this morning at about 3:30 AM. Patient is now complaining moderate, constant, worsening right parietal headache that radiates down to her neck where she is having sharp pains. Patient reports that she got out of bed this morning and suddenly fell over. She states that next she was aware, she had pain in her head.  She says that she turned on the lamp and realized that she hit her head on her wooden bed post. She also thinks that she hit her right shoulder. Patient cannot remember why she was getting out of bed. Patient reports possible LOC during this incident. She also had some lightheadedness and dizziness preventing her from getting up right away after the fall.   She denies any visual changes, vomiting, chest pain, abdominal pain or any other symptoms. Patient took Aleve at home this morning for pain. She states that she went to work, but had to leave early due to pain. She has a medical history of fibromyalgia, GERD, anxiety and depression. She states that her neurologist advised her to come to the ED. Patient has never smoked and does not use alcohol.    Past Medical History  Diagnosis Date  . GERD (gastroesophageal reflux disease)   . Fibromyalgia   . Anxiety   . Depression     Past Surgical History  Procedure Laterality Date  . Abdominal hysterectomy      No family history on file.  History  Substance Use Topics  . Smoking status: Never  Smoker   . Smokeless tobacco: Never Used  . Alcohol Use: No    OB History   Grav Para Term Preterm Abortions TAB SAB Ect Mult Living                  Review of Systems A complete 10 system review of systems was obtained and all systems are negative except as noted in the HPI and PMH.   Allergies  Penicillins  Home Medications   Current Outpatient Rx  Name  Route  Sig  Dispense  Refill  . ALPRAZolam (XANAX PO)   Oral   Take by mouth.         . Escitalopram Oxalate (LEXAPRO PO)   Oral   Take by mouth.         Marland Kitchen GABAPENTIN PO   Oral   Take by mouth.         . lidocaine (LIDODERM) 5 %   Transdermal   Place 1 patch onto the skin daily. Remove & Discard patch within 12 hours or as directed by MD           Triage Vitals: BP 132/97  Pulse 72  Temp(Src) 98.4 F (36.9 C) (Oral)  Resp 18  Wt 180 lb (81.647 kg)  BMI 29.07 kg/m2  SpO2 98%  Physical Exam  Constitutional: She is oriented to person, place, and time. She appears  well-developed and well-nourished.  HENT:  Head: Normocephalic and atraumatic.  Tender to right parietal scalp.  Eyes: Conjunctivae and EOM are normal. Pupils are equal, round, and reactive to light.  Neck: Normal range of motion. Neck supple.  Cardiovascular: Normal rate, regular rhythm and normal heart sounds.   Pulmonary/Chest: Effort normal and breath sounds normal. No respiratory distress.  Speaking on the phone in full sentences. No distress.  Abdominal: Soft. There is no tenderness.  Musculoskeletal: Normal range of motion.  Right paraspinal pain.   Neurological: She is alert and oriented to person, place, and time.  No ataxia on finger to nose bilaterally. No pronator drift. 5/5 strength throughout. CN 2-12 intact. Equal grip strength. Sensation intact. Gait is normal.  Skin: Skin is warm and dry.  Psychiatric: She has a normal mood and affect. Her behavior is normal.    ED Course  Procedures (including critical care  time) DIAGNOSTIC STUDIES: Oxygen Saturation is 98% on room air, normal by my interpretation.    COORDINATION OF CARE: 6:19 PM- Patient presents to the ED after a fall and head injury that occurred this morning at 3:30 am. She states that she hit her head on a wooden bed post and may have also hit her shoulder. She does not recall why she fell. Will order CT head wo contrast and x-ray of right shoulder. No neurological deficits on exam. Patient informed of current plan for treatment and evaluation and agrees with plan at this time.   7:30 PM- Patient is reporting that she has scratches and abrasions to anterior left upper thigh. Believes that tetanus is UTD. Discussed imaging results with patient which are all normal for acute changes. Patient is now wearing sunglasses and is reporting photosensitivity. She states that she is still having pain. Will order 2 tablets of Vicodin 5-325 mg.   Labs Reviewed  PREGNANCY, URINE  URINALYSIS, ROUTINE W REFLEX MICROSCOPIC    Dg Shoulder Right  11/12/2012  *RADIOLOGY REPORT*  Clinical Data: Fall, right shoulder pain.  RIGHT SHOULDER - 2+ VIEW  Comparison: None  Findings: Degenerative changes in the right AC joint.  Glenohumeral joint is unremarkable. No acute bony abnormality.  Specifically, no fracture, subluxation, or dislocation.  Soft tissues are intact.  IMPRESSION: No acute bony abnormality.   Original Report Authenticated By: Charlett Nose, M.D.    Ct Head Wo Contrast  11/12/2012  *RADIOLOGY REPORT*  Clinical Data: Fall, hit head.  CT HEAD WITHOUT CONTRAST  Technique:  Contiguous axial images were obtained from the base of the skull through the vertex without contrast.  Comparison: None.  Findings: No acute intracranial abnormality.  Specifically, no hemorrhage, hydrocephalus, mass lesion, acute infarction, or significant intracranial injury.  No acute calvarial abnormality. Visualized paranasal sinuses and mastoids clear.  Orbital soft tissues  unremarkable.  IMPRESSION: Normal study.   Original Report Authenticated By: Charlett Nose, M.D.      No diagnosis found.    MDM  Fall earlier this morning after rolling out of bed. Possible loss of consciousness. No vomiting.  Patient in no distress. Speaking on the phone. No focal neurological deficits. No C-spine pain.  CT head negative. No vomiting in the ED. Neurological exam intact. Ambulatory and tolerating by mouth. We'll discharge with head injury precautions.   Date: 11/12/2012  Rate: 65  Rhythm: normal sinus rhythm  QRS Axis: normal  Intervals: normal  ST/T Wave abnormalities: normal  Conduction Disutrbances:none  Narrative Interpretation:   Old EKG Reviewed: none available  I personally performed the services described in this documentation, which was scribed in my presence. The recorded information has been reviewed and is accurate.    Glynn Octave, MD 11/12/12 2004

## 2012-11-12 NOTE — ED Notes (Signed)
Patient states that Vicodin upsets her stomach & that she'd prefer ibuprofen or something else instead. MD notified.

## 2012-11-12 NOTE — ED Notes (Signed)
Pt provided with ginger-ale. Tolerating well at this time

## 2012-11-13 ENCOUNTER — Encounter: Payer: Self-pay | Admitting: Neurology

## 2012-11-13 ENCOUNTER — Ambulatory Visit (INDEPENDENT_AMBULATORY_CARE_PROVIDER_SITE_OTHER): Payer: Managed Care, Other (non HMO) | Admitting: Neurology

## 2012-11-13 ENCOUNTER — Telehealth: Payer: Self-pay | Admitting: Neurology

## 2012-11-13 VITALS — BP 116/78 | HR 79 | Wt 184.0 lb

## 2012-11-13 DIAGNOSIS — IMO0001 Reserved for inherently not codable concepts without codable children: Secondary | ICD-10-CM

## 2012-11-13 DIAGNOSIS — R209 Unspecified disturbances of skin sensation: Secondary | ICD-10-CM

## 2012-11-13 DIAGNOSIS — R6889 Other general symptoms and signs: Secondary | ICD-10-CM

## 2012-11-13 DIAGNOSIS — G4710 Hypersomnia, unspecified: Secondary | ICD-10-CM

## 2012-11-13 DIAGNOSIS — S139XXA Sprain of joints and ligaments of unspecified parts of neck, initial encounter: Secondary | ICD-10-CM

## 2012-11-13 MED ORDER — TIZANIDINE HCL 2 MG PO TABS: 2.0000 mg | ORAL_TABLET | Freq: Every day | ORAL | Status: DC

## 2012-11-13 NOTE — Telephone Encounter (Signed)
I called and spoke with the patient concerning her fall. Patient stated she's still sore and thinks she had a seizure. Patient will be seeing Dr.Willis today at 1:30.

## 2012-11-13 NOTE — Progress Notes (Signed)
Reason for visit: Fibromyalgia  Jennifer Henry is an 47 y.o. female  History of present illness:  Jennifer Henry is a 47 year old right-handed black female with a history of fibromyalgia. The patient in the past has not been able to tolerate Lyrica, and she is on gabapentin taking 300 mg in the evening. The patient cannot take gabapentin during the day secondary to drowsiness. The patient fell at home early in the morning on 11/12/2012. The patient apparently hit the bed post, knocking it over. The patient has had a right sided headache, neck pain, and some tingling into the right arm and leg. The patient went to the emergency room, and a CT scan of the brain was done which was unremarkable. The patient continues to have headaches, and she comes to the office today for an evaluation. The patient complains of some neck stiffness. The patient did not bite her tongue or lose control of the bowels or the bladder with the fall. The patient believes that she was unconscious for about 5 minutes. The patient has been given ibuprofen to take. The patient continues to have ongoing problems with fatigue, muscle soreness, and sensory complaints associated with her fibromyalgia.  Past Medical History  Diagnosis Date  . GERD (gastroesophageal reflux disease)   . Fibromyalgia   . Anxiety   . Depression   . Obesity, mild     Past Surgical History  Procedure Laterality Date  . Abdominal hysterectomy      Family History  Problem Relation Age of Onset  . Hypertension Mother   . Diabetes Mother   . Cancer - Colon Father   . Hypertension Father     Social history:  reports that she has never smoked. She has never used smokeless tobacco. She reports that she does not drink alcohol or use illicit drugs.  Allergies:  Allergies  Allergen Reactions  . Penicillins   . Lyrica (Pregabalin) Rash    Medications:  Current Outpatient Prescriptions on File Prior to Visit  Medication Sig Dispense Refill  .  lidocaine (LIDODERM) 5 % Place 1 patch onto the skin daily. Remove & Discard patch within 12 hours or as directed by MD       No current facility-administered medications on file prior to visit.    ROS:  Out of a complete 14 system review of symptoms, the patient complains only of the following symptoms, and all other reviewed systems are negative.  Fevers, chills, fatigue Eye pain Snoring Confusion, headache, numbness Joint pain Restless legs  Blood pressure 116/78, pulse 79, weight 184 lb (83.462 kg).  Physical Exam  General: The patient is alert and cooperative at the time of the examination. The patient is minimally obese.  Neuromuscular: The patient lacks about 20 of lateral rotation of the cervical spine bilaterally.  Skin: No significant peripheral edema is noted.   Neurologic Exam  Cranial nerves: Facial symmetry is present. Speech is normal, no aphasia or dysarthria is noted. Extraocular movements are full. Visual fields are full.  Motor: The patient has good strength in all 4 extremities.  Coordination: The patient has good finger-nose-finger and heel-to-shin bilaterally.  Gait and station: The patient has a normal gait. Tandem gait is slightly unsteady. Romberg is negative. No drift is seen.  Reflexes: Deep tendon reflexes are symmetric.   Assessment/Plan:  1. Fibromyalgia  2. Cervical strain, cervicogenic headache  The patient will be placed on tizanidine at nighttime. The patient will be set up for physical therapy for neuromuscular therapy  on the neck. She will continue the ibuprofen. The patient will followup through this office in about 3 or 4 months.  Marlan Palau MD 11/13/2012 7:37 PM  [facility] [address] [place], [place] [address]  Phone [phone] Fax [phone]

## 2012-11-16 ENCOUNTER — Telehealth: Payer: Self-pay | Admitting: *Deleted

## 2012-11-16 NOTE — Telephone Encounter (Signed)
I Called patient. I left a message. I'll call back tomorrow.

## 2012-11-16 NOTE — Telephone Encounter (Signed)
Patient called stating she experienced a sharp pain on the right side of her temple that  went all down into her mouth and slight pressure on her forehead.

## 2012-12-25 ENCOUNTER — Telehealth: Payer: Self-pay | Admitting: Neurology

## 2012-12-25 NOTE — Telephone Encounter (Signed)
I called and left a message for the patient to callback to the office to speak concerning her message.

## 2013-03-12 ENCOUNTER — Telehealth: Payer: Self-pay | Admitting: Neurology

## 2013-03-12 NOTE — Telephone Encounter (Signed)
We will try to get this patient in sooner for revisit. May see a nurse practitioner.

## 2013-03-12 NOTE — Telephone Encounter (Signed)
Patient was last seen in May and would like an appointment to come in and be seen again sooner due to a migraine. Patient wasn't seen for that in May but having problems now. Please advise.

## 2013-03-13 ENCOUNTER — Telehealth: Payer: Self-pay | Admitting: *Deleted

## 2013-03-14 ENCOUNTER — Telehealth: Payer: Self-pay | Admitting: *Deleted

## 2013-03-14 NOTE — Telephone Encounter (Signed)
Called patient and left her a detailed message stating that her appointment would be 03-27-13 to see CM. If she can not make this appointment she is to call the office and cancel or reschedule.

## 2013-03-15 ENCOUNTER — Ambulatory Visit (INDEPENDENT_AMBULATORY_CARE_PROVIDER_SITE_OTHER): Payer: Managed Care, Other (non HMO) | Admitting: Neurology

## 2013-03-15 ENCOUNTER — Encounter: Payer: Self-pay | Admitting: Neurology

## 2013-03-15 VITALS — BP 129/88 | HR 71 | Wt 188.0 lb

## 2013-03-15 DIAGNOSIS — G43009 Migraine without aura, not intractable, without status migrainosus: Secondary | ICD-10-CM

## 2013-03-15 DIAGNOSIS — IMO0001 Reserved for inherently not codable concepts without codable children: Secondary | ICD-10-CM

## 2013-03-15 DIAGNOSIS — M79609 Pain in unspecified limb: Secondary | ICD-10-CM

## 2013-03-15 HISTORY — DX: Migraine without aura, not intractable, without status migrainosus: G43.009

## 2013-03-15 MED ORDER — PREDNISONE 5 MG PO TABS: ORAL_TABLET | ORAL | Status: DC

## 2013-03-15 NOTE — Telephone Encounter (Signed)
DUPLICATE; APPT SCHEDULED 03/15/13.

## 2013-03-15 NOTE — Patient Instructions (Signed)
We will go on prednisone for 6 days for the headache. Stop the Mobic while on the prednisone. Increase the tizanidine to 2 tablets at night. Stop the gabapentin at night.

## 2013-03-15 NOTE — Progress Notes (Signed)
Reason for visit: Fibromyalgia  Jennifer Henry is an 47 y.o. female  History of present illness:  Jennifer Henry is a 47 year old right-handed black female with a history of fibromyalgia. The patient continues to have neuromuscular discomfort throughout the body, also associated with fatigue. The patient requires a nap during the day. 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 Within the last 3 days, the patient has had migraine headaches involving the left posterior head, bifrontal area, associated with some left neck and shoulder discomfort. The patient on average has 2 or 3 headaches a month, usually in the front of the head. The patient indicates that the gabapentin at night is no longer effective, but she is unable to tolerate doses higher than 300 mg. The patient is on tizanidine, only taking 2 mg at night. The patient uses a Lidoderm patch for the left shoulder and neck for discomfort. The patient uses Xanax at night for sleep. In the past, the patient has not tolerated Cymbalta, Abilify, or Lyrica. The patient returns for an evaluation.  Past Medical History  Diagnosis Date  . GERD (gastroesophageal reflux disease)   . Fibromyalgia   . Anxiety   . Depression   . Obesity, mild   . Migraine without aura, without mention of intractable migraine without mention of status migrainosus 03/15/2013    Past Surgical History  Procedure Laterality Date  . Abdominal hysterectomy      Family History  Problem Relation Age of Onset  . Hypertension Mother   . Diabetes Mother   . Cancer - Colon Father   . Hypertension Father     Social history:  reports that she has never smoked. She has never used smokeless tobacco. She reports that she does not drink alcohol or use illicit drugs.    Allergies  Allergen Reactions  . Abilify [Aripiprazole]     Hallucinations, increased depression  .  Cymbalta [Duloxetine Hcl]     Hallucinations, increased depression  . Penicillins   . Lyrica [Pregabalin] Rash    Medications:  Current Outpatient Prescriptions on File Prior to Visit  Medication Sig Dispense Refill  . ALPRAZolam (XANAX) 0.5 MG tablet Take 0.5 mg by mouth 3 (three) times daily as needed for sleep.      Marland Kitchen escitalopram (LEXAPRO) 20 MG tablet Take 20 mg by mouth daily.      Marland Kitchen ibuprofen (ADVIL,MOTRIN) 800 MG tablet Take 800 mg by mouth every 8 (eight) hours as needed for pain.      Marland Kitchen lidocaine (LIDODERM) 5 % Place 1 patch onto the skin daily. Remove & Discard patch within 12 hours or as directed by MD       No current facility-administered medications on file prior to visit.    ROS:  Out of a complete 14 system review of symptoms, the patient complains only of the following symptoms, and all other reviewed systems are negative.  Fatigue Blurred vision, double vision Constipation Joint pain, achy muscles Headache, numbness, difficulty swallowing Depression, anxiety, not enough sleep, decreased energy Insomnia, restless legs, snoring  Blood pressure 129/88, pulse 71, weight 188 lb (85.276 kg).  Physical Exam  General: The patient is alert and cooperative at the time of the examination.  Neuromuscular: Range of movement of the cervical spine is full.  Skin: No significant peripheral edema is noted.   Neurologic Exam  Cranial nerves: Facial symmetry is present. Speech is normal,  no aphasia or dysarthria is noted. Extraocular movements are full. Visual fields are full.  Motor: The patient has good strength in all 4 extremities. With extension of the right arm, and extension at the wrist, downward pressure on the hand and uses pain in the right elbow.  Coordination: The patient has good finger-nose-finger and heel-to-shin bilaterally.  Gait and station: The patient has a normal gait. Tandem gait is normal. Romberg is negative. No drift is seen.  Reflexes: Deep  tendon reflexes are symmetric.   Assessment/Plan:  1. Moderate mild to  2. Migraine headache  3. Right arm discomfort  The patient will go off of the gabapentin, as she indicates that this is not helping. The patient will go up on the tizanidine taking 4 mg at night. The patient will be placed on a prednisone Dosepak, 5 mg 6 day pack for the migraine. If the migraine does not abate, Topamax may be used in the future. The patient will go off of Mobic while she is taking the prednisone. The patient will followup for EMG evaluation of the right arm, and nerve conduction studies of both arms. The patient may have a lateral epicondylitis on the right.  Marlan Palau MD 03/15/2013 8:14 PM  [facility] [address] [place], [place] [address]  Phone [phone] Fax [phone]

## 2013-03-15 NOTE — Telephone Encounter (Signed)
CALLED PATIENT AND SCHEDULED AN APPT 03/15/13.

## 2013-03-27 ENCOUNTER — Ambulatory Visit: Payer: Self-pay | Admitting: Nurse Practitioner

## 2013-03-27 ENCOUNTER — Encounter: Payer: Self-pay | Admitting: Neurology

## 2013-03-27 ENCOUNTER — Encounter (INDEPENDENT_AMBULATORY_CARE_PROVIDER_SITE_OTHER): Payer: Self-pay | Admitting: Radiology

## 2013-03-27 ENCOUNTER — Ambulatory Visit (INDEPENDENT_AMBULATORY_CARE_PROVIDER_SITE_OTHER): Payer: Managed Care, Other (non HMO) | Admitting: Neurology

## 2013-03-27 DIAGNOSIS — M7710 Lateral epicondylitis, unspecified elbow: Secondary | ICD-10-CM

## 2013-03-27 DIAGNOSIS — M79609 Pain in unspecified limb: Secondary | ICD-10-CM

## 2013-03-27 DIAGNOSIS — M7711 Lateral epicondylitis, right elbow: Secondary | ICD-10-CM

## 2013-03-27 HISTORY — DX: Lateral epicondylitis, right elbow: M77.11

## 2013-03-27 NOTE — Progress Notes (Signed)
Jennifer Henry is a 47 year old patient with a history of fibromyalgia. The patient has had significant discomfort involving the right elbow and forearm. The patient has had surgery for a nerve release, and for a tendon repair. The patient has had ongoing discomfort in the right arm, and she is being evaluated for a possible neuropathy or a cervical radiculopathy.  EMG and nerve conduction study was done today, nerve conductions involved both arms, EMG of the right arm. These studies were unremarkable. No evidence of a neuropathy or a cervical radiculopathy is seen.  The patient likely has a right lateral epicondylitis. I have written a prescription for a splint, and the patient will be following up with her orthopedic surgeon, Dr. August Saucer. The patient followup here in 6 months.

## 2013-03-27 NOTE — Procedures (Signed)
  HISTORY:  Jennifer Henry is a 47 year old patient with a history of a right elbow and forearm discomfort. The patient has a history of fibromyalgia. The patient is being evaluated for a possible neuropathy or a cervical radiculopathy. The patient felt possibly to have a right lateral epicondylitis.  NERVE CONDUCTION STUDIES:  Nerve conduction studies were performed on both upper extremities. The distal motor latencies and motor amplitudes for the median, radial, and ulnar nerves were within normal limits. The F wave latencies and nerve conduction velocities for the median and ulnar nerves were also normal. The sensory latencies for the median, radial, and ulnar nerves were normal, with the exception that the right median sensory latency was slightly prolonged.   EMG STUDIES:  EMG study was performed on the right upper extremity:  The first dorsal interosseous muscle reveals 2 to 4 K units with full recruitment. No fibrillations or positive waves were noted. The abductor pollicis brevis muscle reveals 2 to 4 K units with full recruitment. No fibrillations or positive waves were noted. The extensor indicis proprius muscle reveals 1 to 3 K units with full recruitment. No fibrillations or positive waves were noted. The pronator teres muscle reveals 2 to 3 K units with full recruitment. No fibrillations or positive waves were noted. The biceps muscle reveals 1 to 2 K units with full recruitment. No fibrillations or positive waves were noted. The triceps muscle reveals 2 to 4 K units with full recruitment. No fibrillations or positive waves were noted. The anterior deltoid muscle reveals 2 to 3 K units with full recruitment. No fibrillations or positive waves were noted. The cervical paraspinal muscles were tested at 2 levels. No abnormalities of insertional activity were seen at either level tested. There was poor relaxation.   IMPRESSION:  Nerve conduction studies done on both upper extremity  were essentially within normal limits bilaterally. No evidence of a neuropathy is seen. EMG evaluation of the right upper extremity is normal, without evidence of a right cervical radiculopathy.  Marlan Palau MD 03/27/2013 4:32 PM  [facility] [address] [place], [place] [address]  Phone [phone] Fax [phone]

## 2013-04-09 DIAGNOSIS — L089 Local infection of the skin and subcutaneous tissue, unspecified: Secondary | ICD-10-CM | POA: Insufficient documentation

## 2013-04-09 DIAGNOSIS — IMO0002 Reserved for concepts with insufficient information to code with codable children: Secondary | ICD-10-CM | POA: Insufficient documentation

## 2013-04-30 ENCOUNTER — Ambulatory Visit: Payer: Managed Care, Other (non HMO) | Admitting: Neurology

## 2013-05-10 ENCOUNTER — Telehealth: Payer: Self-pay | Admitting: Neurology

## 2013-05-10 NOTE — Telephone Encounter (Signed)
I called patient. The patient was on 20 mg of Lexapro, she tried to stop the medication suddenly. The patient had a withdrawal syndrome. If she tries to come off the medication, she should go to 10 mg daily for 3 weeks, 5 mg daily for 3 weeks, and then stop. The patient is back on Lexapro at this time.

## 2013-05-10 NOTE — Telephone Encounter (Addendum)
Spoke with patient and she said that she stopped the lexapro a week and 1/2 ago, became lightheaded, off balance, tingling in face, nauseated, constipation and hard time swallowing, Could not lift right leg and very sore, lower back pain, night sweats, took lexapro dosage Friday(11/07) along with 2 xanax tablets,went to sleep and feel  better except for the back pain and her leg.  She has  Scheduled an appt. With her Pcp for next Friday, and will be seeing her gastro doctor on tues.  Did she do any damage to her  body?

## 2013-05-10 NOTE — Telephone Encounter (Signed)
pt calling states she is having complications. was told she could stop taking her medication. and she feels that she is now off balance and gets sensations in her face. she is also nausated. she also gets lightheaded.she went to the pharmacy and got a dose of xanax because she can't sleep. has been advised of balance.

## 2013-07-30 ENCOUNTER — Telehealth: Payer: Self-pay | Admitting: Neurology

## 2013-07-30 NOTE — Telephone Encounter (Signed)
Having some neurological problems, pain in her face, states its her forehead, eye and it goes into her head. States she had dental work done and she doesn't know if that triggered her fibromalgia. Please call

## 2013-07-30 NOTE — Telephone Encounter (Signed)
I called the patient, left a message. The patient went to the dentist and had a procedure done. Following the numbing medication, the patient began developing what sounds like neuropathic pain. The patient is on ibuprofen which is reasonable. If the pain does not improve over the next week or 2, we can see her in the office. The procedure was done on 07/24/2013.

## 2013-07-30 NOTE — Telephone Encounter (Signed)
Patient said that after going to dentist to have a cavity placed on 07/24/13, after numbness subsided and eating she felt pain.  She went back to dentist and they suggested seeing neurologist because he didn't see anything.  She has taking 800mg  ibuprofren (1/2 am, 1/2 at bedtime) and felt some better. Is planning to see PCP in the morning.

## 2013-07-31 ENCOUNTER — Telehealth: Payer: Self-pay | Admitting: Neurology

## 2013-07-31 NOTE — Telephone Encounter (Signed)
Patient does not want to schedule with NP, will call back on Thursday for any cancellations

## 2013-07-31 NOTE — Telephone Encounter (Signed)
TALKED TO DR. Mervin HackWILLIS YESTERDAY--NEEDS SOON APPT

## 2013-08-01 ENCOUNTER — Telehealth: Payer: Self-pay | Admitting: Neurology

## 2013-08-01 NOTE — Telephone Encounter (Signed)
Spoke with patient and informed that Dr Anne HahnWillis has no available appt slots, she said that she is now willing to see NP for a sooner appt. Scheduled with CMartin,

## 2013-08-02 ENCOUNTER — Encounter: Payer: Self-pay | Admitting: Nurse Practitioner

## 2013-08-02 ENCOUNTER — Encounter (INDEPENDENT_AMBULATORY_CARE_PROVIDER_SITE_OTHER): Payer: Self-pay

## 2013-08-02 ENCOUNTER — Ambulatory Visit (INDEPENDENT_AMBULATORY_CARE_PROVIDER_SITE_OTHER): Payer: Managed Care, Other (non HMO) | Admitting: Nurse Practitioner

## 2013-08-02 ENCOUNTER — Telehealth: Payer: Self-pay | Admitting: Neurology

## 2013-08-02 VITALS — BP 113/72 | HR 68 | Ht 66.5 in | Wt 189.0 lb

## 2013-08-02 DIAGNOSIS — G43009 Migraine without aura, not intractable, without status migrainosus: Secondary | ICD-10-CM

## 2013-08-02 DIAGNOSIS — R51 Headache: Secondary | ICD-10-CM

## 2013-08-02 DIAGNOSIS — R519 Headache, unspecified: Secondary | ICD-10-CM

## 2013-08-02 MED ORDER — GABAPENTIN 100 MG PO CAPS: 100.0000 mg | ORAL_CAPSULE | Freq: Three times a day (TID) | ORAL | Status: DC

## 2013-08-02 MED ORDER — TIZANIDINE HCL 4 MG PO TABS: 4.0000 mg | ORAL_TABLET | Freq: Every day | ORAL | Status: DC

## 2013-08-02 NOTE — Telephone Encounter (Signed)
Rx has been resent for a 90 day supply.

## 2013-08-02 NOTE — Telephone Encounter (Signed)
Pt called and stated that the Gabapentin Rx was called in for a 30 day supply.  Her insurance will only pay for it if it is a 90 day supply.  A new prescription needs to be called into the [facility] [address]  Please call

## 2013-08-02 NOTE — Progress Notes (Signed)
[facility]  PATIENT: Jennifer Henry DOB: 08/14/1965   REASON FOR VISIT: Followup for headache and facial pain   HISTORY OF PRESENT ILLNESS: Jennifer Henry, 48 year old female returns for followup. She was last seen by Dr. Anne Henry 03/15/2013 and then had EMG nerve conduction studies on 03/27/13 which were normal She has a history of fibromyalgia and headache. She is now complaining of facial pain on the right side she had a root canal and is having 'severe nerve pain on the right side of the head and also the left". She describes this as a "tingling sensation pressure" . She has had side effects to Cymbalta Abilify and Lyrica in the past. She has stopped her gabapentin. She has stopped her tizanidine. She is currently on clindamycin and ibuprofen 600(4) times daily. She returns for reevaluation    HISTORY:of fibromyalgia. The patient continues to have neuromuscular discomfort throughout the body, also associated with fatigue. The patient requires a nap during the day. 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 Within the last 3 days, the patient has had migraine headaches involving the left posterior head, bifrontal area, associated with some left neck and shoulder discomfort. The patient on average has 2 or 3 headaches a month, usually in the front of the head. The patient indicates that the gabapentin at night is no longer effective, but she is unable to tolerate doses higher than 300 mg. The patient is on tizanidine, only taking 2 mg at night. The patient uses a Lidoderm patch for the left shoulder and neck for discomfort. The patient uses Xanax at night for sleep. In the past, the patient has not tolerated Cymbalta, Abilify, or Lyrica. The patient returns for an evaluation.   REVIEW OF SYSTEMS: Full 14 system review of systems performed and notable only for those listed, all  others are neg:  Constitutional: Fatigue Cardiovascular: N/A  Ear/Nose/Throat: N/A  Skin: N/A  Eyes: N/A  Respiratory: N/A  Gastroitestinal: N/A  Hematology/Lymphatic: N/A  Endocrine: N/A Musculoskeletal:N/A  Allergy/Immunology: N/A  Neurological: Headache, weakness  Psychiatric: N/A   ALLERGIES: Allergies  Allergen Reactions  . Abilify [Aripiprazole]     Hallucinations, increased depression  . Cymbalta [Duloxetine Hcl]     Hallucinations, increased depression  . Penicillins   . Lyrica [Pregabalin] Rash    HOME MEDICATIONS: Outpatient Prescriptions Prior to Visit  Medication Sig Dispense Refill  . escitalopram (LEXAPRO) 20 MG tablet Take 20 mg by mouth daily.      Marland Kitchen. ibuprofen (ADVIL,MOTRIN) 800 MG tablet Take 800 mg by mouth every 8 (eight) hours as needed for pain.      Marland Kitchen. lidocaine (LIDODERM) 5 % Place 1 patch onto the skin daily. Remove & Discard patch within 12 hours or as directed by MD      . gabapentin (NEURONTIN) 100 MG capsule Take 100 mg by mouth 3 (three) times daily.       . meloxicam (MOBIC) 7.5 MG tablet Take 7.5 mg by mouth daily.      . methocarbamol (ROBAXIN) 500 MG tablet Take 500 mg by mouth daily.      . naproxen (NAPROSYN) 500 MG tablet Take 500 mg by mouth daily as needed.      Marland Kitchen. omeprazole-sodium bicarbonate (ZEGERID) 40-1100 MG per capsule Take 40 capsules by mouth daily before breakfast.       . ondansetron (ZOFRAN) 4 MG tablet Take 4 mg by mouth  every 8 (eight) hours as needed.       . predniSONE (DELTASONE) 5 MG tablet Began taking 6 tablets daily, taper by one tablet daily until off the medication.  21 tablet  0  . tiZANidine (ZANAFLEX) 2 MG tablet Take 4 mg by mouth at bedtime.       No facility-administered medications prior to visit.    PAST MEDICAL HISTORY: Past Medical History  Diagnosis Date  . GERD (gastroesophageal reflux disease)   . Fibromyalgia   . Anxiety   . Depression   . Obesity, mild   . Migraine without aura, without  mention of intractable migraine without mention of status migrainosus 03/15/2013  . Lateral epicondylitis of right elbow 03/27/2013    PAST SURGICAL HISTORY: Past Surgical History  Procedure Laterality Date  . Abdominal hysterectomy      FAMILY HISTORY: Family History  Problem Relation Age of Onset  . Hypertension Mother   . Diabetes Mother   . Cancer - Colon Father   . Hypertension Father     SOCIAL HISTORY: History   Social History  . Marital Status: Single    Spouse Name: N/A    Number of Children: 1  . Years of Education: College   Occupational History  .  [employer]   Social History Main Topics  . Smoking status: Never Smoker   . Smokeless tobacco: Never Used  . Alcohol Use: No  . Drug Use: No  . Sexual Activity: Not on file   Other Topics Concern  . Not on file   Social History Narrative   Patient lives at home with daughter.    Patient has one child.    Patient is currently working.    Patient is single.   Patient is right handed.    Patient is currently in school for her Masters.      PHYSICAL EXAM  Filed Vitals:   08/02/13 0908  BP: 113/72  Pulse: 68  Height: 5' 6.5" (1.689 m)  Weight: 189 lb (85.73 kg)   Body mass index is 30.05 kg/(m^2).  Generalized: Well developed, mildly obese female in no acute distress  Head: normocephalic and atraumatic,. Oropharynx benign  Neck: Supple, no carotid bruits  Cardiac: Regular rate rhythm, no murmur  Musculoskeletal: No deformity   Neurological examination   Mentation: Alert oriented to time, place, history taking. Follows all commands speech and language fluent  Cranial nerve II-XII: .Pupils were equal round reactive to light extraocular movements were full, visual field were full on confrontational test. Facial sensation and strength were normal. hearing was intact to finger rubbing bilaterally. Uvula tongue midline. head turning and shoulder shrug were normal and symmetric.Tongue protrusion  into cheek strength was normal. Motor: normal bulk and tone, full strength in the BUE, BLE,  No focal weakness Sensory: normal and symmetric to light touch, pinprick, and  vibration  Coordination: finger-nose-finger, heel-to-shin bilaterally, no dysmetria Reflexes: Brachioradialis 2/2, biceps 2/2, triceps 2/2, patellar 2/2, Achilles 2/2, plantar responses were flexor bilaterally. Gait and Station: Rising up from seated position without assistance, normal stance,  moderate stride, good arm swing, smooth turning, able to perform tiptoe, and heel walking without difficulty. Tandem gait is steady  DIAGNOSTIC DATA (LABS, IMAGING, TESTING) - I reviewed patient records, labs, notes, testing and imaging myself where available.  Lab Results  Component Value Date   WBC 6.5 08/09/2012   HGB 12.1 08/09/2012   HCT 35.4* 08/09/2012   MCV 91.5 08/09/2012   PLT 273  08/09/2012      Component Value Date/Time   NA 140 08/09/2012 1210   K 4.0 08/09/2012 1210   CL 106 08/09/2012 1210   CO2 26 08/09/2012 1210   GLUCOSE 93 08/09/2012 1210   BUN 10 08/09/2012 1210   CREATININE 1.00 08/09/2012 1210   CALCIUM 9.2 08/09/2012 1210   PROT 7.3 11/06/2009 1400   ALBUMIN 4.0 11/06/2009 1400   AST 18 11/06/2009 1400   ALT 17 11/06/2009 1400   ALKPHOS 50 11/06/2009 1400   BILITOT 0.4 11/06/2009 1400   GFRNONAA 67* 08/09/2012 1210   GFRAA 78* 08/09/2012 1210   N/C EMG 03/27/13 was normal.    ASSESSMENT AND PLAN  48 y.o. year old female  has a past medical history of  Fibromyalgia; Anxiety; Depression; Obesity, mild; Migraine without aura, without mention of intractable migraine without mention of status migrainosus (03/15/2013); and Lateral epicondylitis of right elbow (03/27/2013). New complaint today of facial pain following a root canal .  Restart gabapentin 100 mg 3 times a day  Tizanidine 4 mg at bedtime Followup in 3 months Nilda Riggs, [facility], [facility], APRN  [facility] [address] [place], [place]  [address] [phone]

## 2013-08-02 NOTE — Patient Instructions (Signed)
Restart gabapentin 100 mg 3 times a day   tizanidine 4 mg at bedtime Followup in 3 months

## 2013-08-02 NOTE — Progress Notes (Signed)
I have read the note, and I agree with the clinical assessment and plan.  WILLIS,CHARLES KEITH   

## 2013-08-06 ENCOUNTER — Telehealth: Payer: Self-pay | Admitting: Neurology

## 2013-08-06 NOTE — Telephone Encounter (Signed)
We already sent a 90 day supply to the pharmacy on the 30th: gabapentin (NEURONTIN) 100 MG capsule 270 capsule 1 08/02/2013     Sig - Route: Take 1 capsule (100 mg total) by mouth 3 (three) times daily. - Oral    E-Prescribing Status: Receipt confirmed by pharmacy (08/02/2013 4:27 PM EST)                Pharmacy    [id number] - [place], [place] - [address]   I called the patient back.  Got no answer.  Left message.  I called the pharmacy, they are not open yet.  Will call back again after 9 am .

## 2013-08-06 NOTE — Telephone Encounter (Signed)
I called the pharmacy again.

## 2013-08-06 NOTE — Telephone Encounter (Signed)
Spoke with Pam at the pharmacy.  She said they did get the Rx last week for 90 days, and it has been ready for pick up since Friday.  She said the patient keeps calling asking about an old Rx number.  They will notify the patient the Rx is waiting to be picked up.

## 2013-08-06 NOTE — Telephone Encounter (Signed)
Patient calling to state that her insurance only covers 90 day supply of Gabapentin and her script was only written for 30 days. Patient states pharmacy hasn't heard anything from us and she says that she needs it right away.

## 2013-08-13 ENCOUNTER — Telehealth: Payer: Self-pay | Admitting: *Deleted

## 2013-08-13 NOTE — Telephone Encounter (Signed)
Patient was advised when the medication was ordered to start slow with the medication and see how it reacts with her system. She claims that is what she is doing today. She may need only 1 or 2 Gabapentins a day. She verbalizes understanding

## 2013-10-02 ENCOUNTER — Telehealth: Payer: Self-pay | Admitting: Neurology

## 2013-10-02 NOTE — Telephone Encounter (Signed)
Pt called. She stated that she is having a lot of nerve activity in her neck and back with a lot of pain. She states it feels like her body is shocking itself. When she gets up and walks around she gets light headed and the light headedness comes and goes.  When she bends over she is getting a lot of nerve pain in her forehead. She is also experiencing a slight headache.  She says this has been going on ~1 week and it is getting unbearable.  Please call to discuss.  Thank you

## 2013-10-02 NOTE — Telephone Encounter (Signed)
I called patient. The patient indicates that she is having some increase in neuromuscular pain. The patient was taken off of Lexapro last week, and she is now experiencing shocklike sensations and dizziness. This is a withdrawal syndrome from the Lexapro. The patient will have heightened episodes over the next 3 weeks, get better over the next 3 weeks, and then the episodes will disappear. The patient notes some increase in neuromuscular discomfort, we will double the gabapentin dose taking 200 mg 3 times daily. The patient is now on Brintellix.

## 2013-11-28 ENCOUNTER — Telehealth: Payer: Self-pay | Admitting: *Deleted

## 2013-11-28 NOTE — Telephone Encounter (Signed)
Okay to work this patient in some time within the next 3 or 4 weeks.

## 2013-11-28 NOTE — Telephone Encounter (Signed)
Pt called and is having problems with her R hand/finger swelling, pain, (previous R elbow surgery).  Has appt 01-20-14 0800.  Phone disconnected.   I called back and she would like to be seen sooner.   She did make known that only wanted to see MD, not NP.   I told her that Dr. Anne Hahn has own NP, Aundra Millet.  She still wanted to see MD.

## 2013-11-29 ENCOUNTER — Telehealth: Payer: Self-pay | Admitting: *Deleted

## 2013-11-29 NOTE — Telephone Encounter (Signed)
Dr. Anne Hahn has 2:30  Appointment on 6/3. Called to see if patient is able to come in.

## 2013-12-05 ENCOUNTER — Ambulatory Visit: Payer: Self-pay | Admitting: Neurology

## 2013-12-05 ENCOUNTER — Ambulatory Visit: Payer: Managed Care, Other (non HMO) | Admitting: Neurology

## 2014-01-20 ENCOUNTER — Ambulatory Visit (INDEPENDENT_AMBULATORY_CARE_PROVIDER_SITE_OTHER): Payer: Managed Care, Other (non HMO) | Admitting: Neurology

## 2014-01-20 ENCOUNTER — Encounter (INDEPENDENT_AMBULATORY_CARE_PROVIDER_SITE_OTHER): Payer: Self-pay

## 2014-01-20 ENCOUNTER — Encounter: Payer: Self-pay | Admitting: Neurology

## 2014-01-20 VITALS — BP 112/76 | HR 72 | Ht 66.0 in | Wt 189.0 lb

## 2014-01-20 DIAGNOSIS — G43009 Migraine without aura, not intractable, without status migrainosus: Secondary | ICD-10-CM

## 2014-01-20 DIAGNOSIS — S139XXS Sprain of joints and ligaments of unspecified parts of neck, sequela: Secondary | ICD-10-CM

## 2014-01-20 NOTE — Patient Instructions (Addendum)
  May try magnesium oxide tablets 250 mg at night for the headache and fibromyalgia pain. Look out for diarrhea.    Fibromyalgia Fibromyalgia is a disorder that is often misunderstood. It is associated with muscular pains and tenderness that comes and goes. It is often associated with fatigue and sleep disturbances. Though it tends to be long-lasting, fibromyalgia is not life-threatening. CAUSES  The exact cause of fibromyalgia is unknown. People with certain gene types are predisposed to developing fibromyalgia and other conditions. Certain factors can play a role as triggers, such as:  Spine disorders.  Arthritis.  Severe injury (trauma) and other physical stressors.  Emotional stressors. SYMPTOMS   The main symptom is pain and stiffness in the muscles and joints, which can vary over time.  Sleep and fatigue problems. Other related symptoms may include:  Bowel and bladder problems.  Headaches.  Visual problems.  Problems with odors and noises.  Depression or mood changes.  Painful periods (dysmenorrhea).  Dryness of the skin or eyes. DIAGNOSIS  There are no specific tests for diagnosing fibromyalgia. Patients can be diagnosed accurately from the specific symptoms they have. The diagnosis is made by determining that nothing else is causing the problems. TREATMENT  There is no cure. Management includes medicines and an active, healthy lifestyle. The goal is to enhance physical fitness, decrease pain, and improve sleep. HOME CARE INSTRUCTIONS   Only take over-the-counter or prescription medicines as directed by your caregiver. Sleeping pills, tranquilizers, and pain medicines may make your problems worse.  Low-impact aerobic exercise is very important and advised for treatment. At first, it may seem to make pain worse. Gradually increasing your tolerance will overcome this feeling.  Learning relaxation techniques and how to control stress will help you. Biofeedback,  visual imagery, hypnosis, muscle relaxation, yoga, and meditation are all options.  Anti-inflammatory medicines and physical therapy may provide short-term help.  Acupuncture or massage treatments may help.  Take muscle relaxant medicines as suggested by your caregiver.  Avoid stressful situations.  Plan a healthy lifestyle. This includes your diet, sleep, rest, exercise, and friends.  Find and practice a hobby you enjoy.  Join a fibromyalgia support group for interaction, ideas, and sharing advice. This may be helpful. SEEK MEDICAL CARE IF:  You are not having good results or improvement from your treatment. FOR MORE INFORMATION  National Fibromyalgia Association: www.fmaware.org Arthritis Foundation: www.arthritis.org Document Released: 06/20/2005 Document Revised: 09/12/2011 Document Reviewed: 09/30/2009 [facility] Patient Information 2015 [employer], [place]. This information is not intended to replace advice given to you by your health care provider. Make sure you discuss any questions you have with your health care provider.

## 2014-01-20 NOTE — Progress Notes (Signed)
Reason for visit: Headache  Jennifer Henry is an 48 y.o. female  History of present illness:  Jennifer Henry is a 48 year old right-handed black female with a history of fibromyalgia and migraine headache. The patient indicates that she is getting about 3 headaches a week. The patient indicates that she ran out of alprazolam, and she is not sleeping well at night. She is trying to get a refill, but she has been unsuccessful. The patient indicates that in the morning she does not feel like getting up out of bed, she feels an increase in fatigue. She has had some problems with alternating diarrhea and constipation, and she has had some bloody diarrhea. The patient now is on Linzess with some benefit. She is frequently calling out work because she does not feel like going to work. The patient continues to have some fibromyalgia pain, worse on the left side of the body. She has cut back on her gabapentin taking 100 mg 3 times a day instead of 200 mg 3 times daily. In the past, she has not been able to tolerate the gabapentin in higher doses, and she is unable to tolerate Lyrica or Cymbalta. She reports some numbness on the right side of her tongue, and some discomfort down into the right forearm and hand. She had a ganglion cyst resected from her middle finger.  Past Medical History  Diagnosis Date  . GERD (gastroesophageal reflux disease)   . Fibromyalgia   . Anxiety   . Depression   . Obesity, mild   . Migraine without aura, without mention of intractable migraine without mention of status migrainosus 03/15/2013  . Lateral epicondylitis of right elbow 03/27/2013    Past Surgical History  Procedure Laterality Date  . Abdominal hysterectomy      Family History  Problem Relation Age of Onset  . Hypertension Mother   . Diabetes Mother   . Cancer - Colon Father   . Hypertension Father     Social history:  reports that she has never smoked. She has never used smokeless tobacco. She reports  that she does not drink alcohol or use illicit drugs.    Allergies  Allergen Reactions  . Abilify [Aripiprazole]     Hallucinations, increased depression  . Cymbalta [Duloxetine Hcl]     Hallucinations, increased depression  . Penicillins   . Lyrica [Pregabalin] Rash    Medications:  Current Outpatient Prescriptions on File Prior to Visit  Medication Sig Dispense Refill  . gabapentin (NEURONTIN) 100 MG capsule Take 200 mg by mouth 3 (three) times daily.      Marland Kitchen. lidocaine (LIDODERM) 5 % Place 1 patch onto the skin daily. Remove & Discard patch within 12 hours or as directed by MD      . ALPRAZolam Prudy Feeler(XANAX) 0.5 MG tablet        No current facility-administered medications on file prior to visit.    ROS:  Out of a complete 14 system review of symptoms, the patient complains only of the following symptoms, and all other reviewed systems are negative.  Abdominal pain, constipation, diarrhea Insomnia Back pain Numbness Depression  Blood pressure 112/76, pulse 72, height 5\' 6"  (1.676 m), weight 189 lb (85.73 kg).  Physical Exam  General: The patient is alert and cooperative at the time of the examination. The patient has a flat affect.  Neuromuscular: The patient is good range of movement of the cervical and lumbosacral spine.  Skin: No significant peripheral edema is noted.  Neurologic Exam  Mental status: The patient is oriented x 3.  Cranial nerves: Facial symmetry is present. Speech is normal, no aphasia or dysarthria is noted. Extraocular movements are full. Visual fields are full.  Motor: The patient has good strength in all 4 extremities.  Sensory examination: Soft touch sensation is symmetric on the face, arms, and legs.  Coordination: The patient has good finger-nose-finger and heel-to-shin bilaterally.  Gait and station: The patient has a normal gait. Tandem gait is normal. Romberg is negative. No drift is seen.  Reflexes: Deep tendon reflexes are  symmetric.   Assessment/Plan:  1. Fibromyalgia  2. Migraine headache  3. Possible irritable bowel syndrome  4. Depression  The patient is having increasing problems with depression. Her Brintellix may need to be increased in dose. She needs to get back on her alprazolam to help her sleep at night. She may benefit from low-dose magnesium oxide tablets to help some of her fibromyalgia pain, migraine headache, and constipation issues. The patient will continue her gabapentin at 100 mg 3 times daily. She will followup in about 6 months. I have recommended that she get into low grade exercise to help her fibromyalgia and her depression.  Marlan Palau MD 01/20/2014 8:22 AM  [facility] [address] [place], [place] [address]  Phone [phone] Fax [phone]

## 2014-02-11 ENCOUNTER — Other Ambulatory Visit: Payer: Self-pay | Admitting: Orthopaedic Surgery

## 2014-02-11 DIAGNOSIS — I82612 Acute embolism and thrombosis of superficial veins of left upper extremity: Secondary | ICD-10-CM

## 2014-02-12 ENCOUNTER — Ambulatory Visit
Admission: RE | Admit: 2014-02-12 | Discharge: 2014-02-12 | Disposition: A | Discharge status: Home / Self Care | Payer: Managed Care, Other (non HMO) | Source: Ambulatory Visit | Attending: Orthopaedic Surgery | Admitting: Orthopaedic Surgery

## 2014-02-12 DIAGNOSIS — I82612 Acute embolism and thrombosis of superficial veins of left upper extremity: Secondary | ICD-10-CM

## 2014-02-16 ENCOUNTER — Other Ambulatory Visit: Payer: Self-pay | Admitting: Neurology

## 2014-03-05 ENCOUNTER — Ambulatory Visit
Admission: RE | Admit: 2014-03-05 | Discharge: 2014-03-05 | Disposition: A | Discharge status: Home / Self Care | Payer: Managed Care, Other (non HMO) | Source: Ambulatory Visit | Attending: Orthopedic Surgery | Admitting: Orthopedic Surgery

## 2014-03-05 ENCOUNTER — Other Ambulatory Visit: Payer: Self-pay | Admitting: Orthopedic Surgery

## 2014-03-05 DIAGNOSIS — M79604 Pain in right leg: Secondary | ICD-10-CM

## 2014-03-05 DIAGNOSIS — M79605 Pain in left leg: Principal | ICD-10-CM

## 2014-04-04 ENCOUNTER — Encounter: Payer: Self-pay | Admitting: Hematology & Oncology

## 2014-04-15 ENCOUNTER — Telehealth: Payer: Self-pay | Admitting: Hematology & Oncology

## 2014-04-15 NOTE — Telephone Encounter (Signed)
Left vm w NEW PATIENT today to remind them of their appointment with Dr. Ennever. Also, advised them to bring all medication bottles and insurance card information. ° °

## 2014-04-16 ENCOUNTER — Other Ambulatory Visit (HOSPITAL_BASED_OUTPATIENT_CLINIC_OR_DEPARTMENT_OTHER): Payer: Managed Care, Other (non HMO) | Admitting: Lab

## 2014-04-16 ENCOUNTER — Encounter: Payer: Self-pay | Admitting: Hematology & Oncology

## 2014-04-16 ENCOUNTER — Ambulatory Visit: Payer: Managed Care, Other (non HMO)

## 2014-04-16 ENCOUNTER — Ambulatory Visit (HOSPITAL_BASED_OUTPATIENT_CLINIC_OR_DEPARTMENT_OTHER): Payer: Managed Care, Other (non HMO) | Admitting: Hematology & Oncology

## 2014-04-16 VITALS — BP 121/85 | HR 73 | Temp 98.2°F | Resp 16 | Ht 66.0 in | Wt 191.0 lb

## 2014-04-16 DIAGNOSIS — I82602 Acute embolism and thrombosis of unspecified veins of left upper extremity: Secondary | ICD-10-CM

## 2014-04-16 DIAGNOSIS — I82622 Acute embolism and thrombosis of deep veins of left upper extremity: Secondary | ICD-10-CM

## 2014-04-16 DIAGNOSIS — I82403 Acute embolism and thrombosis of unspecified deep veins of lower extremity, bilateral: Secondary | ICD-10-CM

## 2014-04-16 DIAGNOSIS — M797 Fibromyalgia: Secondary | ICD-10-CM

## 2014-04-16 LAB — CBC WITH DIFFERENTIAL (CANCER CENTER ONLY)
BASO#: 0 10*3/uL (ref 0.0–0.2)
BASO%: 0.2 % (ref 0.0–2.0)
EOS ABS: 0.1 10*3/uL (ref 0.0–0.5)
EOS%: 1 % (ref 0.0–7.0)
HCT: 37 % (ref 34.8–46.6)
HGB: 12.6 g/dL (ref 11.6–15.9)
LYMPH#: 2.3 10*3/uL (ref 0.9–3.3)
LYMPH%: 37.9 % (ref 14.0–48.0)
MCH: 31.8 pg (ref 26.0–34.0)
MCHC: 34.1 g/dL (ref 32.0–36.0)
MCV: 93 fL (ref 81–101)
MONO#: 0.3 10*3/uL (ref 0.1–0.9)
MONO%: 4.7 % (ref 0.0–13.0)
NEUT#: 3.3 10*3/uL (ref 1.5–6.5)
NEUT%: 56.2 % (ref 39.6–80.0)
Platelets: 307 10*3/uL (ref 145–400)
RBC: 3.96 10*6/uL (ref 3.70–5.32)
RDW: 12.6 % (ref 11.1–15.7)
WBC: 5.9 10*3/uL (ref 3.9–10.0)

## 2014-04-17 ENCOUNTER — Telehealth: Payer: Self-pay | Admitting: Hematology & Oncology

## 2014-04-17 NOTE — Telephone Encounter (Signed)
Pt aware of 10-16 and 11-27 appointments. Delice Bisonara aware also

## 2014-04-17 NOTE — Addendum Note (Signed)
Addended by: Arlan OrganENNEVER, PETER R on: 04/17/2014 01:04 PM   Modules accepted: Orders

## 2014-04-17 NOTE — Progress Notes (Signed)
Referral MD  Reason for Referral: Thrombo-embolic event in the left arm   Chief Complaint  Patient presents with  . NEW PATIENT  : I hurt all over. I feel terrible. I have a blood clot in my left arm.  HPI: Ms. Norell is a very nice 48 year old Afro-American female. She has bad fibromyalgia. She sees several doctors for this. She apparently is going to see a rheumatologist at [facility] in February.  She has headaches. She has diffuse pain. She is working but is visible at work because of all the pain.  She developed some pain and swelling in the left arm back in August. She has seen an orthopedic before. She's had hip problems.  A Doppler was done. 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  She saw Dr. Selena Batten her family doctor. He put her on one of the new oral agents- Savaysa. She's on 60 mg daily. She's had no problems with this.  He kindly referred to the cancer Center to see how we can help. She's never had a thrombus before. She does not smoke. She's had one child. She had a miscarriage for this.  Apparently, he did Lupus anticoagulant assay. This was positive.  I don't think she's had any formal hypercoagulable studies done.  Is no family history of blood clots.  She is not on oral contraceptives. She's not been on oral contraceptives. She does not have diabetes. She does not smoke. She's not been on any kind of long trips.  Her main problem clearly has all the pain that she is having. She's having headaches. She might be having migraines.  Is no cough. There is no shortness of breath. She's tired all the time. She's had a partial hysterectomy. This probably was 10 years ago.  She to see gastroenterology. She has had upper and lower endoscopy in the past year or so.  Overall, her performance status is ECOG 1.              Past Medical History  Diagnosis Date  . GERD (gastroesophageal reflux disease)   . Fibromyalgia   . Anxiety   . Depression   .  Obesity, mild   . Migraine without aura, without mention of intractable migraine without mention of status migrainosus 03/15/2013  . Lateral epicondylitis of right elbow 03/27/2013  :  Past Surgical History  Procedure Laterality Date  . Abdominal hysterectomy    . Ganglion cyst resection Right     Middle finger  :  Current outpatient prescriptions:acetaminophen (TYLENOL) 325 MG tablet, Take 650 mg by mouth every 6 (six) hours as needed., Disp: , Rfl: ;  ALPRAZolam (XANAX) 0.5 MG tablet, , Disp: , Rfl: ;  dexlansoprazole (DEXILANT) 60 MG capsule, Take 60 mg by mouth daily., Disp: , Rfl: ;  diphenhydrAMINE (BENADRYL) 25 MG tablet, Take 25 mg by mouth every 6 (six) hours as needed., Disp: , Rfl:  edoxaban (SAVAYSA) 60 MG TABS tablet, Take by mouth daily., Disp: , Rfl: ;  gabapentin (NEURONTIN) 100 MG capsule, Take 200 mg by mouth 3 (three) times daily., Disp: , Rfl: ;  lidocaine (LIDODERM) 5 %, Place 1 patch onto the skin daily. Remove & Discard patch within 12 hours or as directed by MD, Disp: , Rfl: ;  LINZESS 145 MCG CAPS capsule, Take 145 mcg by mouth daily., Disp: , Rfl:  NONFORMULARY OR COMPOUNDED ITEM, Apply topically 4 (four) times daily as needed. Compound cream for pain, Disp: , Rfl: ;  sucralfate (CARAFATE)  1 GM/10ML suspension, Take 1 g by mouth 4 (four) times daily -  with meals and at bedtime., Disp: , Rfl: ;  tiZANidine (ZANAFLEX) 4 MG capsule, Take 4 mg by mouth every 6 (six) hours as needed for muscle spasms., Disp: , Rfl: ;  UNABLE TO FIND, Take by mouth every morning. ULTRA FLORA, Disp: , Rfl:  Vortioxetine HBr (BRINTELLIX) 20 MG TABS, Take by mouth daily., Disp: , Rfl: :  :  Allergies  Allergen Reactions  . Abilify [Aripiprazole]     Hallucinations, increased depression  . Cymbalta [Duloxetine Hcl]     Hallucinations, increased depression  . Penicillins   . Lyrica [Pregabalin] Rash  :  Family History  Problem Relation Age of Onset  . Hypertension Mother   . Diabetes  Mother   . Cancer - Colon Father   . Hypertension Father   :  History   Social History  . Marital Status: Single    Spouse Name: N/A    Number of Children: 1  . Years of Education: College   Occupational History  .  [employer]   Social History Main Topics  . Smoking status: Never Smoker   . Smokeless tobacco: Never Used     Comment: never used tobacco  . Alcohol Use: No  . Drug Use: No  . Sexual Activity: Not on file   Other Topics Concern  . Not on file   Social History Narrative   Patient lives at home with daughter.    Patient has one child.    Patient is currently working.    Patient is single.   Patient is right handed.    Patient is currently in school for her Masters.   :  Pertinent items are noted in HPI.  Exam: @IPVITALS @  well-developed and well-nourished Afro-American female. Her vital signs show temperature of 98.2. Pulse 73. Blood pressure 121/85. Weight is 191 pounds. Head and neck exam shows no ocular or oral lesions. There is no scleral icterus. There is no adenopathy in the neck. Thyroid is non-palpable. Lungs are clear. There are no rales, wheezes or rhonchi. Cardiac exam regular rate and rhythm with no murmurs, rubs or bruits. Abdomen is soft tissues good bowel sounds. There is no fluid wave. There is no palpable liver or spleen tip. Back exam shows no tenderness over the spine, ribs or hips. Extremities shows mild nonpitting edema of the left arm. No obvious venous cord is noted. She has good pulses in her extremities. She's good range of motion of her joints. Axillary exam shows no bilateral axillary adenopathy. Skin exam shows no rashes, ecchymosis or petechia. Neurological exam shows no focal neurological deficits.   Recent Labs  04/16/14 1438  WBC 5.9  HGB 12.6  HCT 37.0  PLT 307   No results found for this basename: NA, K, CL, CO2, GLUCOSE, BUN, CREATININE, CALCIUM,  in the last 72 hours  Blood smear review: Normochromic and normocytic  probably separate blood cells. She has no schistocytes. There's no spherocytes. She has no nucleated red blood cells. There are no rouleau formation. White cells were normal in morphology and maturation. There is no immature myeloid or lymphoid forms. Platelets are adequate in number and size.  Pathology: None     Assessment and Plan: Ms.Macintyre is a very nice 48 year old [place] female. She has a thrombus in the left arm.  I am not sure if this lupus anticoagulant is clinically significant. I am repeating this.  Am  not sure she's had any type of formal hypercoagulable studies. I will how to order these.  I think she needs a CT of the chest to make sure that there is no anatomic issue that caused the thrombus in the left arm.  It is very rare to see an idiopathic upper extremity thrombus.  I don't see that we have to do any type of malignant workup.  She clearly has been her issues with respect to all the pain that she is having from fibromyalgias. I don't see that this is an issue with the thrombus.  I told her that having a thrombus is a local problem and rarely indicates any type of systemic problem.  She clearly is incredibly frustrated by the pain that she has and by the way she feels. I wish this am not help do for this.  I spent over an hour with her.  We will plan for the scan and other labs were drawn this week.  I probably will do another Doppler one week see her back in about 6 weeks.  I think that one year of anticoagulation is all that she will need. He was she has a positive lupus anticoagulant, I don't think she needs lifelong anticoagulation. If she does have a positive lupus anticoagulant, I would add aspirin to the anticoagulation that she is on.  I answered her questions. I gave her a prayer blanket. She has a strong faith.

## 2014-04-18 ENCOUNTER — Other Ambulatory Visit (HOSPITAL_BASED_OUTPATIENT_CLINIC_OR_DEPARTMENT_OTHER): Payer: Managed Care, Other (non HMO) | Admitting: Lab

## 2014-04-18 ENCOUNTER — Ambulatory Visit (HOSPITAL_BASED_OUTPATIENT_CLINIC_OR_DEPARTMENT_OTHER)
Admission: RE | Admit: 2014-04-18 | Discharge: 2014-04-18 | Disposition: A | Discharge status: Home / Self Care | Payer: Managed Care, Other (non HMO) | Source: Ambulatory Visit | Attending: Hematology & Oncology | Admitting: Hematology & Oncology

## 2014-04-18 DIAGNOSIS — I82622 Acute embolism and thrombosis of deep veins of left upper extremity: Secondary | ICD-10-CM

## 2014-04-18 LAB — CBC WITH DIFFERENTIAL (CANCER CENTER ONLY)
BASO#: 0 10*3/uL (ref 0.0–0.2)
BASO%: 0.4 % (ref 0.0–2.0)
EOS%: 1.1 % (ref 0.0–7.0)
Eosinophils Absolute: 0.1 10*3/uL (ref 0.0–0.5)
HCT: 35.7 % (ref 34.8–46.6)
HGB: 12.6 g/dL (ref 11.6–15.9)
LYMPH#: 1.8 10*3/uL (ref 0.9–3.3)
LYMPH%: 33.2 % (ref 14.0–48.0)
MCH: 32.6 pg (ref 26.0–34.0)
MCHC: 35.3 g/dL (ref 32.0–36.0)
MCV: 92 fL (ref 81–101)
MONO#: 0.3 10*3/uL (ref 0.1–0.9)
MONO%: 5.1 % (ref 0.0–13.0)
NEUT%: 60.2 % (ref 39.6–80.0)
NEUTROS ABS: 3.3 10*3/uL (ref 1.5–6.5)
PLATELETS: 307 10*3/uL (ref 145–400)
RBC: 3.87 10*6/uL (ref 3.70–5.32)
RDW: 12.6 % (ref 11.1–15.7)
WBC: 5.5 10*3/uL (ref 3.9–10.0)

## 2014-04-18 LAB — LUPUS ANTICOAGULANT PANEL
DRVVT 1 1 MIX: 42.9 s — AB (ref <42.9)
DRVVT CONFIRMATION: 1.01 ratio (ref <1.15)
DRVVT: 54.1 s — AB (ref <42.9)
LUPUS ANTICOAGULANT: NOT DETECTED
PTT Lupus Anticoagulant: 45.4 secs — ABNORMAL HIGH (ref 28.0–43.0)
PTTLA 4:1 Mix: 45.6 secs — ABNORMAL HIGH (ref 28.0–43.0)
PTTLA Confirmation: 0 secs (ref <8.0)

## 2014-04-18 MED ORDER — IOHEXOL 350 MG/ML SOLN
100.0000 mL | Freq: Once | INTRAVENOUS | Status: AC | PRN
Start: 2014-04-18 — End: 2014-04-18
  Administered 2014-04-18: 100 mL via INTRAVENOUS

## 2014-04-21 ENCOUNTER — Telehealth: Payer: Self-pay | Admitting: *Deleted

## 2014-04-21 ENCOUNTER — Telehealth: Payer: Self-pay | Admitting: Nurse Practitioner

## 2014-04-21 NOTE — Telephone Encounter (Addendum)
Message copied by Glee ArvinPICKENPACK-COUSAR, Satvik Parco N on Mon Apr 21, 2014  3:36 PM ------      Message from: Josph MachoENNEVER, PETER R      Created: Fri Apr 18, 2014  6:37 PM       Please call and tell her that the lupus anticoagulant is not detectable. This is not surprising. Pete ------Pt verbalized understanding and appreciation.

## 2014-04-21 NOTE — Telephone Encounter (Addendum)
Message copied by Burnett CorrenteUMLEY, Miaa Latterell L on Mon Apr 21, 2014  2:21 PM ------      Message from: Arlan OrganENNEVER, PETER R      Created: Sun Apr 20, 2014  2:28 PM       Please call to tell her that  the CT scan is negative for any chest problems or any vascular problems that could cause her left arm blood clot. Please see if Nikki P. call her already. Pete ------Pt doesn't have VM. So I was unable to  inform pt that CT scan is negative for any chest problems or any vascular problems that could cause her left arm blood clot

## 2014-05-28 ENCOUNTER — Other Ambulatory Visit: Payer: Self-pay | Admitting: *Deleted

## 2014-05-28 DIAGNOSIS — I82409 Acute embolism and thrombosis of unspecified deep veins of unspecified lower extremity: Secondary | ICD-10-CM

## 2014-05-30 ENCOUNTER — Ambulatory Visit (HOSPITAL_BASED_OUTPATIENT_CLINIC_OR_DEPARTMENT_OTHER): Payer: Managed Care, Other (non HMO) | Admitting: Hematology & Oncology

## 2014-05-30 ENCOUNTER — Other Ambulatory Visit (HOSPITAL_BASED_OUTPATIENT_CLINIC_OR_DEPARTMENT_OTHER): Payer: Managed Care, Other (non HMO) | Admitting: Lab

## 2014-05-30 ENCOUNTER — Encounter: Payer: Self-pay | Admitting: Hematology & Oncology

## 2014-05-30 ENCOUNTER — Ambulatory Visit (HOSPITAL_BASED_OUTPATIENT_CLINIC_OR_DEPARTMENT_OTHER)
Admission: RE | Admit: 2014-05-30 | Discharge: 2014-05-30 | Disposition: A | Discharge status: Home / Self Care | Payer: Managed Care, Other (non HMO) | Source: Ambulatory Visit | Attending: Hematology & Oncology | Admitting: Hematology & Oncology

## 2014-05-30 ENCOUNTER — Telehealth: Payer: Self-pay | Admitting: Hematology & Oncology

## 2014-05-30 VITALS — BP 131/72 | HR 80 | Temp 97.9°F | Resp 20

## 2014-05-30 DIAGNOSIS — I82602 Acute embolism and thrombosis of unspecified veins of left upper extremity: Secondary | ICD-10-CM

## 2014-05-30 DIAGNOSIS — I82409 Acute embolism and thrombosis of unspecified deep veins of unspecified lower extremity: Secondary | ICD-10-CM

## 2014-05-30 DIAGNOSIS — I824Y2 Acute embolism and thrombosis of unspecified deep veins of left proximal lower extremity: Secondary | ICD-10-CM

## 2014-05-30 DIAGNOSIS — I82622 Acute embolism and thrombosis of deep veins of left upper extremity: Secondary | ICD-10-CM

## 2014-05-30 DIAGNOSIS — M25552 Pain in left hip: Secondary | ICD-10-CM

## 2014-05-30 DIAGNOSIS — M797 Fibromyalgia: Secondary | ICD-10-CM

## 2014-05-30 LAB — COMPREHENSIVE METABOLIC PANEL
ALK PHOS: 57 U/L (ref 39–117)
ALT: 13 U/L (ref 0–35)
AST: 12 U/L (ref 0–37)
Albumin: 4 g/dL (ref 3.5–5.2)
BUN: 12 mg/dL (ref 6–23)
CALCIUM: 9.1 mg/dL (ref 8.4–10.5)
CO2: 24 mEq/L (ref 19–32)
Chloride: 107 mEq/L (ref 96–112)
Creatinine, Ser: 0.97 mg/dL (ref 0.50–1.10)
Glucose, Bld: 85 mg/dL (ref 70–99)
Potassium: 4.1 mEq/L (ref 3.5–5.3)
SODIUM: 138 meq/L (ref 135–145)
TOTAL PROTEIN: 6.9 g/dL (ref 6.0–8.3)
Total Bilirubin: 0.3 mg/dL (ref 0.2–1.2)

## 2014-05-30 LAB — CBC WITH DIFFERENTIAL (CANCER CENTER ONLY)
BASO#: 0 10*3/uL (ref 0.0–0.2)
BASO%: 0.2 % (ref 0.0–2.0)
EOS ABS: 0.1 10*3/uL (ref 0.0–0.5)
EOS%: 1.4 % (ref 0.0–7.0)
HCT: 36.6 % (ref 34.8–46.6)
HEMOGLOBIN: 12.4 g/dL (ref 11.6–15.9)
LYMPH#: 1.9 10*3/uL (ref 0.9–3.3)
LYMPH%: 32.9 % (ref 14.0–48.0)
MCH: 31 pg (ref 26.0–34.0)
MCHC: 33.9 g/dL (ref 32.0–36.0)
MCV: 92 fL (ref 81–101)
MONO#: 0.3 10*3/uL (ref 0.1–0.9)
MONO%: 4.9 % (ref 0.0–13.0)
NEUT%: 60.6 % (ref 39.6–80.0)
NEUTROS ABS: 3.4 10*3/uL (ref 1.5–6.5)
PLATELETS: 296 10*3/uL (ref 145–400)
RBC: 4 10*6/uL (ref 3.70–5.32)
RDW: 12.5 % (ref 11.1–15.7)
WBC: 5.7 10*3/uL (ref 3.9–10.0)

## 2014-05-30 NOTE — Progress Notes (Signed)
Hematology and Oncology Follow Up Visit  Jennifer ForestRenetta Henry [id number] Feb 19, 1966 48 y.o. 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 is back for follow-up. This is her second office visit. We first saw her, we did a repeat lupus anticoagulant study. This was negative. We did a CT scan of her chest. Air is no obvious anatomic issue that could've caused the left arm thrombus.  We repeated her Doppler today. There is no evidence of thrombus in the brachial vein.  She still is having a lot of issues. None of these are related to the thrombus. It seems as if these are all related to her fibromyalgia. She is supposed be seen a [facility] in February.  She has a lot of fatigue. She's a lot of pain. She has numbness on the left side of her face. She has a difficult time working.  Again, it sounds like the far myalgia is her biggest problem.  She has had no fever. There's been no leading. She has had no cough. She's had no change in bowel or bladder habits. She does not have any cycles. I think she had a hysterectomy.  She's been complaining of pain in the pelvis. It probably would not hurt to do a ultrasound.  Medications: Current outpatient prescriptions: acetaminophen (TYLENOL) 325 MG tablet, Take 650 mg by mouth every 6 (six) hours as needed., Disp: , Rfl: ;  ALPRAZolam (XANAX) 0.5 MG tablet, , Disp: , Rfl: ;  dexlansoprazole (DEXILANT) 60 MG capsule, Take 60 mg by mouth daily., Disp: , Rfl: ;  diphenhydrAMINE (BENADRYL) 25 MG tablet, Take 25 mg by mouth every 6 (six) hours as needed., Disp: , Rfl:  edoxaban (SAVAYSA) 60 MG TABS tablet, Take by mouth daily., Disp: , Rfl: ;  gabapentin (NEURONTIN) 100 MG capsule, Take 200 mg by mouth 3 (three) times daily., Disp: , Rfl: ;  lidocaine (LIDODERM) 5 %, Place 1 patch onto the skin  daily. Remove & Discard patch within 12 hours or as directed by MD, Disp: , Rfl: ;  LINZESS 145 MCG CAPS capsule, Take 145 mcg by mouth daily., Disp: , Rfl:  NONFORMULARY OR COMPOUNDED ITEM, Apply topically 4 (four) times daily as needed. Compound cream for pain, Disp: , Rfl: ;  sucralfate (CARAFATE) 1 GM/10ML suspension, Take 1 g by mouth 4 (four) times daily -  with meals and at bedtime., Disp: , Rfl: ;  tiZANidine (ZANAFLEX) 4 MG capsule, Take 4 mg by mouth every 6 (six) hours as needed for muscle spasms., Disp: , Rfl: ;  UNABLE TO FIND, Take by mouth every morning. ULTRA FLORA, Disp: , Rfl:  Vortioxetine HBr (BRINTELLIX) 20 MG TABS, Take by mouth daily., Disp: , Rfl:   Allergies:  Allergies  Allergen Reactions  . Abilify [Aripiprazole]     Hallucinations, increased depression  . Cymbalta [Duloxetine Hcl]     Hallucinations, increased depression  . Penicillins   . Lyrica [Pregabalin] Rash    Past Medical History, Surgical history, Social history, and Family History were reviewed and updated.  Review of Systems: As above  Physical Exam:  temperature is 97.9 F (36.6 C). Her blood pressure is 131/72 and her pulse is 80. Her respiration is 20.   Well-developed and well-nourished African-American female. Head and neck exam shows no ocular or oral lesions. She  has no palpable cervical or supraclavicular lymph nodes. Lungs are clear area and cardiac exam regular rate and rhythm with no murmurs, rubs or bruits. Abdomen is soft. She has good bowel sounds. There is some tenderness in the left lower quadrant. There is no palpable liver or spleen tip. Back exam shows no tenderness over the spine, ribs or hips. She has good range of motion of the joints. Extremities shows no clubbing, cyanosis or edema. No lymphedema is noted in the left arm. She has good pulses. She has good range of motion of her joints. There is no joint swelling, warmth or erythema. Skin exam shows no rashes, ecchymoses or  petechia. Neurological exam shows no focal neurological deficits.  Lab Results  Component Value Date   WBC 5.7 05/30/2014   HGB 12.4 05/30/2014   HCT 36.6 05/30/2014   MCV 92 05/30/2014   PLT 296 05/30/2014     Chemistry      Component Value Date/Time   NA 140 08/09/2012 1210   K 4.0 08/09/2012 1210   CL 106 08/09/2012 1210   CO2 26 08/09/2012 1210   BUN 10 08/09/2012 1210   CREATININE 1.00 08/09/2012 1210      Component Value Date/Time   CALCIUM 9.2 08/09/2012 1210   ALKPHOS 50 11/06/2009 1400   AST 18 11/06/2009 1400   ALT 17 11/06/2009 1400   BILITOT 0.4 11/06/2009 1400         Impression and Plan: [name] is 48 year old African-American female. 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 However, I cannot think of any additional studies that we have to to do to try to find an etiology.  I just feel that she is suffering from this fibromyalgia. Maybe the rheumatologist at [facility] can help her out.  I think it would be wise to get her back in 2 months. I would do a of her left arm we see her back.  She will finish up her anticoagulation next month. I do not want to put on any aspirin as this might cause further GI difficulties.  I spent about 45 minutes with her today.      Josph MachoENNEVER,Yaiden Yang R, MD 11/27/20156:50 PM

## 2014-05-30 NOTE — Telephone Encounter (Signed)
Pt aware of 12-2 US and to drink at least 32 oz of water and do not go to the bathroom. Delice Bisonara aware to precert if needed

## 2014-06-02 ENCOUNTER — Other Ambulatory Visit: Payer: Self-pay | Admitting: Hematology & Oncology

## 2014-06-02 DIAGNOSIS — M25552 Pain in left hip: Secondary | ICD-10-CM

## 2014-06-04 ENCOUNTER — Ambulatory Visit (HOSPITAL_COMMUNITY): Payer: Managed Care, Other (non HMO)

## 2014-06-05 ENCOUNTER — Ambulatory Visit (HOSPITAL_BASED_OUTPATIENT_CLINIC_OR_DEPARTMENT_OTHER)
Admission: RE | Admit: 2014-06-05 | Discharge: 2014-06-05 | Disposition: A | Discharge status: Home / Self Care | Payer: Managed Care, Other (non HMO) | Source: Ambulatory Visit | Attending: Hematology & Oncology | Admitting: Hematology & Oncology

## 2014-06-05 DIAGNOSIS — M25552 Pain in left hip: Secondary | ICD-10-CM

## 2014-06-05 DIAGNOSIS — Z90710 Acquired absence of both cervix and uterus: Secondary | ICD-10-CM | POA: Diagnosis not present

## 2014-06-05 DIAGNOSIS — R1032 Left lower quadrant pain: Secondary | ICD-10-CM | POA: Insufficient documentation

## 2014-06-06 ENCOUNTER — Telehealth: Payer: Self-pay | Admitting: Nurse Practitioner

## 2014-06-06 NOTE — Telephone Encounter (Addendum)
-----   Message from Josph MachoPeter R Ennever, MD sent at 06/05/2014  5:11 PM EST ----- Pt verbalized understanding and appreciation.   Please call her and let her know that the ultrasound is absolutely normal. Everything looks okay down the pelvis. Ovaries look okay. There is no fluid. There is no abnormalities they can see. Cindee LamePete

## 2014-06-20 ENCOUNTER — Other Ambulatory Visit: Payer: Self-pay | Admitting: Neurology

## 2014-07-29 ENCOUNTER — Other Ambulatory Visit: Payer: Self-pay | Admitting: Hematology & Oncology

## 2014-07-29 ENCOUNTER — Encounter: Payer: Self-pay | Admitting: Neurology

## 2014-07-29 ENCOUNTER — Ambulatory Visit (INDEPENDENT_AMBULATORY_CARE_PROVIDER_SITE_OTHER): Payer: Managed Care, Other (non HMO) | Admitting: Neurology

## 2014-07-29 VITALS — BP 120/76 | HR 84 | Ht 66.0 in | Wt 194.4 lb

## 2014-07-29 DIAGNOSIS — M609 Myositis, unspecified: Secondary | ICD-10-CM

## 2014-07-29 DIAGNOSIS — M791 Myalgia: Secondary | ICD-10-CM

## 2014-07-29 DIAGNOSIS — IMO0001 Reserved for inherently not codable concepts without codable children: Secondary | ICD-10-CM

## 2014-07-29 DIAGNOSIS — G43019 Migraine without aura, intractable, without status migrainosus: Secondary | ICD-10-CM

## 2014-07-29 DIAGNOSIS — I82622 Acute embolism and thrombosis of deep veins of left upper extremity: Secondary | ICD-10-CM

## 2014-07-29 MED ORDER — GABAPENTIN 100 MG PO CAPS: 200.0000 mg | ORAL_CAPSULE | Freq: Three times a day (TID) | ORAL | Status: DC

## 2014-07-29 NOTE — Progress Notes (Signed)
Reason for visit: Fibromyalgia, headache  Jennifer Henry is an 49 y.o. female  History of present illness:  [name] is a 49 year old right-handed black female with a history of what is felt to be fibromyalgia with migratory pains involving the neck, shoulders, back, thighs, and hips. The patient has been on gabapentin, but she is only on 300 mg daily, she indicates that recently she has taken a leave of absence from work. She is considering going on disability for her fibromyalgia. She does report some occasional headaches that are usually in the right frontotemporal area. Recently, she is had a DVT in the left arm, and she is being evaluated for a possible lupus anticoagulant antibody. She is followed by Dr. Myna HidalgoEnnever. The patient returns to this office for further evaluation. She indicates that she will have some numbness in the arms if she leans on her elbow and flexes at the elbow for a period of time. She indicates that her job description has changed, and she is not able to cope well. 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 She cannot get up and stretch and move about frequently.  Past Medical History  Diagnosis Date  . GERD (gastroesophageal reflux disease)   . Fibromyalgia   . Anxiety   . Depression   . Obesity, mild   . Migraine without aura, without mention of intractable migraine without mention of status migrainosus 03/15/2013  . Lateral epicondylitis of right elbow 03/27/2013  . DVT (deep venous thrombosis)     Past Surgical History  Procedure Laterality Date  . Abdominal hysterectomy    . Ganglion cyst resection Right     Middle finger    Family History  Problem Relation Age of Onset  . Hypertension Mother   . Diabetes Mother   . Cancer - Colon Father   . Hypertension Father   . Heart disease Father     Social history:  reports that she has never smoked. She has never used smokeless tobacco.  She reports that she does not drink alcohol or use illicit drugs.    Allergies  Allergen Reactions  . Abilify [Aripiprazole]     Hallucinations, increased depression  . Cymbalta [Duloxetine Hcl]     Hallucinations, increased depression  . Ibuprofen Other (See Comments)    Irritates stomach.  . Penicillins   . Lyrica [Pregabalin] Rash    Medications:  Current Outpatient Prescriptions on File Prior to Visit  Medication Sig Dispense Refill  . acetaminophen (TYLENOL) 325 MG tablet Take 650 mg by mouth every 6 (six) hours as needed.    . ALPRAZolam (XANAX) 0.5 MG tablet     . dexlansoprazole (DEXILANT) 60 MG capsule Take 60 mg by mouth daily.    . diphenhydrAMINE (BENADRYL) 25 MG tablet Take 25 mg by mouth every 6 (six) hours as needed.    . lidocaine (LIDODERM) 5 % Place 1 patch onto the skin daily. Remove & Discard patch within 12 hours or as directed by MD    . Karlene EinsteinLINZESS 145 MCG CAPS capsule Take 145 mcg by mouth daily.    . NONFORMULARY OR COMPOUNDED ITEM Apply topically 4 (four) times daily as needed. Compound cream for pain    . sucralfate (CARAFATE) 1 GM/10ML suspension Take 1 g by mouth 4 (four) times daily -  with meals and at bedtime.    Marland Kitchen. tiZANidine (ZANAFLEX) 4 MG capsule Take  4 mg by mouth every 6 (six) hours as needed for muscle spasms.    Marland Kitchen UNABLE TO FIND Take by mouth every morning. ULTRA FLORA    . Vortioxetine HBr (BRINTELLIX) 20 MG TABS Take by mouth daily.     No current facility-administered medications on file prior to visit.    ROS:  Out of a complete 14 system review of symptoms, the patient complains only of the following symptoms, and all other reviewed systems are negative.  Chills, fatigue, excessive sweating Shortness of breath Swollen abdomen, rectal bleeding, diarrhea, rectal pain Daytime drowsiness, snoring Joint pain, joint swelling, back pain, achy muscles, neck pain Swollen lymph nodes Headache, numbness, weakness Confusion, depression,  anxiety  Blood pressure 120/76, pulse 84, height  (1.676 m), weight 194 lb 6.4 oz (88.179 kg).  Physical Exam  General: The patient is alert and cooperative at the time of the examination. The patient is minimally to moderately obese.  Neuromuscular: The patient has good range of movement of the low back. The patient lacks about 20 of full lateral rotation of the cervical spine bilaterally.  Skin: No significant peripheral edema is noted.   Neurologic Exam  Mental status: The patient is oriented x 3.  Cranial nerves: Facial symmetry is present. Speech is normal, no aphasia or dysarthria is noted. Extraocular movements are full. Visual fields are full.  Motor: The patient has good strength in all 4 extremities.  Sensory examination: Soft touch sensation is symmetric on the face, arms, and legs.  Coordination: The patient has good finger-nose-finger and heel-to-shin bilaterally.  Gait and station: The patient has a normal gait. Tandem gait is normal. Romberg is negative. No drift is seen.  Reflexes: Deep tendon reflexes are symmetric.   Assessment/Plan:  1. Fibromyalgia  2. Migraine headache  The patient indicates that she is having ongoing discomfort. She has not been able to take much gabapentin, but I have asked her to increase the dosing to taking 200 mg 3 times daily. The patient will follow-up in 5 or 6 months. She is considering going on disability. She will contact our office if further problems arise.  Marlan Palau MD 07/29/2014 6:38 PM  [facility] [address] [place], [place] [address]  Phone [phone] Fax [phone]

## 2014-07-29 NOTE — Patient Instructions (Signed)
Fibromyalgia Fibromyalgia is a disorder that is often misunderstood. It is associated with muscular pains and tenderness that comes and goes. It is often associated with fatigue and sleep disturbances. Though it tends to be long-lasting, fibromyalgia is not life-threatening. CAUSES  The exact cause of fibromyalgia is unknown. People with certain gene types are predisposed to developing fibromyalgia and other conditions. Certain factors can play a role as triggers, such as:  Spine disorders.  Arthritis.  Severe injury (trauma) and other physical stressors.  Emotional stressors. SYMPTOMS   The main symptom is pain and stiffness in the muscles and joints, which can vary over time.  Sleep and fatigue problems. Other related symptoms may include:  Bowel and bladder problems.  Headaches.  Visual problems.  Problems with odors and noises.  Depression or mood changes.  Painful periods (dysmenorrhea).  Dryness of the skin or eyes. DIAGNOSIS  There are no specific tests for diagnosing fibromyalgia. Patients can be diagnosed accurately from the specific symptoms they have. The diagnosis is made by determining that nothing else is causing the problems. TREATMENT  There is no cure. Management includes medicines and an active, healthy lifestyle. The goal is to enhance physical fitness, decrease pain, and improve sleep. HOME CARE INSTRUCTIONS   Only take over-the-counter or prescription medicines as directed by your caregiver. Sleeping pills, tranquilizers, and pain medicines may make your problems worse.  Low-impact aerobic exercise is very important and advised for treatment. At first, it may seem to make pain worse. Gradually increasing your tolerance will overcome this feeling.  Learning relaxation techniques and how to control stress will help you. Biofeedback, visual imagery, hypnosis, muscle relaxation, yoga, and meditation are all options.  Anti-inflammatory medicines and  physical therapy may provide short-term help.  Acupuncture or massage treatments may help.  Take muscle relaxant medicines as suggested by your caregiver.  Avoid stressful situations.  Plan a healthy lifestyle. This includes your diet, sleep, rest, exercise, and friends.  Find and practice a hobby you enjoy.  Join a fibromyalgia support group for interaction, ideas, and sharing advice. This may be helpful. SEEK MEDICAL CARE IF:  You are not having good results or improvement from your treatment. FOR MORE INFORMATION  National Fibromyalgia Association: www.fmaware.org Arthritis Foundation: www.arthritis.org Document Released: 06/20/2005 Document Revised: 09/12/2011 Document Reviewed: 09/30/2009 ExitCare Patient Information 2015 [employer]. This information is not intended to replace advice given to you by your health care provider. Make sure you discuss any questions you have with your health care provider.  

## 2014-08-01 ENCOUNTER — Other Ambulatory Visit (HOSPITAL_BASED_OUTPATIENT_CLINIC_OR_DEPARTMENT_OTHER): Payer: Managed Care, Other (non HMO) | Admitting: Lab

## 2014-08-01 ENCOUNTER — Ambulatory Visit (HOSPITAL_BASED_OUTPATIENT_CLINIC_OR_DEPARTMENT_OTHER)
Admission: RE | Admit: 2014-08-01 | Discharge: 2014-08-01 | Disposition: A | Discharge status: Home / Self Care | Payer: Managed Care, Other (non HMO) | Source: Ambulatory Visit | Attending: Hematology & Oncology | Admitting: Hematology & Oncology

## 2014-08-01 ENCOUNTER — Ambulatory Visit (HOSPITAL_BASED_OUTPATIENT_CLINIC_OR_DEPARTMENT_OTHER): Payer: Managed Care, Other (non HMO) | Admitting: Family

## 2014-08-01 ENCOUNTER — Encounter: Payer: Self-pay | Admitting: Family

## 2014-08-01 ENCOUNTER — Other Ambulatory Visit: Payer: Self-pay | Admitting: *Deleted

## 2014-08-01 VITALS — BP 121/86 | HR 74 | Temp 97.9°F | Resp 18 | Wt 192.0 lb

## 2014-08-01 DIAGNOSIS — M79602 Pain in left arm: Secondary | ICD-10-CM | POA: Diagnosis present

## 2014-08-01 DIAGNOSIS — M7989 Other specified soft tissue disorders: Secondary | ICD-10-CM | POA: Insufficient documentation

## 2014-08-01 DIAGNOSIS — Z86718 Personal history of other venous thrombosis and embolism: Secondary | ICD-10-CM

## 2014-08-01 DIAGNOSIS — I82622 Acute embolism and thrombosis of deep veins of left upper extremity: Secondary | ICD-10-CM

## 2014-08-01 DIAGNOSIS — IMO0001 Reserved for inherently not codable concepts without codable children: Secondary | ICD-10-CM

## 2014-08-01 DIAGNOSIS — G43019 Migraine without aura, intractable, without status migrainosus: Secondary | ICD-10-CM

## 2014-08-01 LAB — CBC WITH DIFFERENTIAL (CANCER CENTER ONLY)
BASO#: 0 10*3/uL (ref 0.0–0.2)
BASO%: 0.2 % (ref 0.0–2.0)
EOS ABS: 0.1 10*3/uL (ref 0.0–0.5)
EOS%: 1.9 % (ref 0.0–7.0)
HCT: 34.5 % — ABNORMAL LOW (ref 34.8–46.6)
HEMOGLOBIN: 11.8 g/dL (ref 11.6–15.9)
LYMPH#: 1.7 10*3/uL (ref 0.9–3.3)
LYMPH%: 30.4 % (ref 14.0–48.0)
MCH: 31.2 pg (ref 26.0–34.0)
MCHC: 34.2 g/dL (ref 32.0–36.0)
MCV: 91 fL (ref 81–101)
MONO#: 0.4 10*3/uL (ref 0.1–0.9)
MONO%: 6.6 % (ref 0.0–13.0)
NEUT#: 3.5 10*3/uL (ref 1.5–6.5)
NEUT%: 60.9 % (ref 39.6–80.0)
Platelets: 311 10*3/uL (ref 145–400)
RBC: 3.78 10*6/uL (ref 3.70–5.32)
RDW: 13.1 % (ref 11.1–15.7)
WBC: 5.7 10*3/uL (ref 3.9–10.0)

## 2014-08-01 LAB — COMPREHENSIVE METABOLIC PANEL
ALT: 30 U/L (ref 0–35)
AST: 26 U/L (ref 0–37)
Albumin: 3.8 g/dL (ref 3.5–5.2)
Alkaline Phosphatase: 63 U/L (ref 39–117)
BUN: 10 mg/dL (ref 6–23)
CHLORIDE: 106 meq/L (ref 96–112)
CO2: 27 mEq/L (ref 19–32)
Calcium: 9 mg/dL (ref 8.4–10.5)
Creatinine, Ser: 0.94 mg/dL (ref 0.50–1.10)
Glucose, Bld: 88 mg/dL (ref 70–99)
Potassium: 4.1 mEq/L (ref 3.5–5.3)
Sodium: 139 mEq/L (ref 135–145)
TOTAL PROTEIN: 6.6 g/dL (ref 6.0–8.3)
Total Bilirubin: 0.4 mg/dL (ref 0.2–1.2)

## 2014-08-01 NOTE — Progress Notes (Signed)
[facility]  [phone] [phone]  ID: Jacobo Forest OB: [birth year] MR#: [id number] JYN#:[id number] Patient Care Team: Pearson Grippe, MD as PCP - General (Internal Medicine) Cammy Copa, MD as Consulting Physician (Orthopedic Surgery)  DIAGNOSIS: 1. 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 Severe fibromyalgia  INTERVAL HISTORY: Ms.Enck is here today for a follow-up. This is her second office visit. She is doing fairly well.  She has a lot of problems with her fibromyalgia. She would prefer to be seen by rheumatology locally and plans on cancelling her appointment at [facility]. We will refer her to Dr. Fatima Sanger.  Her doppler of the left arm today was negative for DVT.  She c/o fatigue and SOB with exertion. She does experience migraines periodically.  No fever, chills, n/v, cough, rash, dizziness, chest pain, palpitations, abdominal pain, constipation, diarrhea, blood in urine or stool.  No numbness or tingling in her extremities.  Her appetite is ok and she is staying hydrated.   CURRENT TREATMENT: Sayvasa-finished in December  REVIEW OF SYSTEMS: All other 10 point review of systems is negative.   PAST MEDICAL HISTORY: Past Medical History  Diagnosis Date  . GERD (gastroesophageal reflux disease)   . Fibromyalgia   . Anxiety   . Depression   . Obesity, mild   . Migraine without aura, without mention of intractable migraine without mention of status migrainosus 03/15/2013  . Lateral epicondylitis of right elbow 03/27/2013  . DVT (deep venous thrombosis)     PAST SURGICAL HISTORY: Past Surgical History  Procedure Laterality Date  . Abdominal hysterectomy    . Ganglion cyst resection Right     Middle finger    FAMILY HISTORY Family History  Problem Relation Age of Onset  . Hypertension Mother   . Diabetes Mother   . Cancer - Colon Father   . Hypertension Father   . Heart disease Father     GYNECOLOGIC HISTORY:  No  LMP recorded. Patient has had a hysterectomy.   SOCIAL HISTORY: History   Social History  . Marital Status: Single    Spouse Name: N/A    Number of Children: 1  . Years of Education: College   Occupational History  .  [employer]   Social History Main Topics  . Smoking status: Never Smoker   . Smokeless tobacco: Never Used     Comment: never used tobacco  . Alcohol Use: No  . Drug Use: No  . Sexual Activity: Not on file   Other Topics Concern  . Not on file   Social History Narrative   Patient lives at home with daughter.    Patient has one child.    Patient is currently working.    Patient is single.   Patient is right handed.    Patient is currently in school for her Masters.     ADVANCED DIRECTIVES:  <no information>  HEALTH MAINTENANCE: History  Substance Use Topics  . Smoking status: Never Smoker   . Smokeless tobacco: Never Used     Comment: never used tobacco  . Alcohol Use: No   Colonoscopy: PAP: Bone density: Lipid panel:  Allergies  Allergen Reactions  . Abilify [Aripiprazole]     Hallucinations, increased depression  . Cymbalta [Duloxetine Hcl]     Hallucinations, increased depression  . Ibuprofen Other (See Comments)    Irritates stomach.  . Penicillins   . Lyrica [Pregabalin] Rash    Current Outpatient Prescriptions  Medication Sig Dispense  Refill  . acetaminophen (TYLENOL) 325 MG tablet Take 650 mg by mouth every 6 (six) hours as needed.    . ALPRAZolam (XANAX) 0.5 MG tablet     . dexlansoprazole (DEXILANT) 60 MG capsule Take 60 mg by mouth daily.    . diphenhydrAMINE (BENADRYL) 25 MG tablet Take 25 mg by mouth every 6 (six) hours as needed.    . gabapentin (NEURONTIN) 100 MG capsule Take 2 capsules (200 mg total) by mouth 3 (three) times daily. 540 capsule 1  . lidocaine (LIDODERM) 5 % Place 1 patch onto the skin daily. Remove & Discard patch within 12 hours or as directed by MD    . LINZESS 145 MCG CAPS capsule Take 145 mcg Karlene Einsteinby  mouth daily.    . NONFORMULARY OR COMPOUNDED ITEM Apply topically 4 (four) times daily as needed. Compound cream for pain    . sucralfate (CARAFATE) 1 GM/10ML suspension Take 1 g by mouth 4 (four) times daily -  with meals and at bedtime.    Marland Kitchen. tiZANidine (ZANAFLEX) 4 MG capsule Take 4 mg by mouth every 6 (six) hours as needed for muscle spasms.    Marland Kitchen. UNABLE TO FIND Take by mouth every morning. ULTRA FLORA    . Vortioxetine HBr (BRINTELLIX) 20 MG TABS Take by mouth daily.     No current facility-administered medications for this visit.    OBJECTIVE: Filed Vitals:   08/01/14 1127  BP: 121/86  Pulse: 74  Temp: 97.9 F (36.6 C)  Resp: 18    Filed Weights   08/01/14 1127  Weight: 192 lb (87.091 kg)   ECOG FS:1 - Symptomatic but completely ambulatory Ocular: Sclerae unicteric, pupils equal, round and reactive to light Ear-nose-throat: Oropharynx clear, dentition fair Lymphatic: No cervical or supraclavicular adenopathy Lungs no rales or rhonchi, good excursion bilaterally Heart regular rate and rhythm, no murmur appreciated Abd soft, nontender, positive bowel sounds MSK no focal spinal tenderness, no joint edema Neuro: non-focal, well-oriented, appropriate affect Breasts: Deferred  LAB RESULTS: CMP     Component Value Date/Time   NA 138 05/30/2014 1114   K 4.1 05/30/2014 1114   CL 107 05/30/2014 1114   CO2 24 05/30/2014 1114   GLUCOSE 85 05/30/2014 1114   BUN 12 05/30/2014 1114   CREATININE 0.97 05/30/2014 1114   CALCIUM 9.1 05/30/2014 1114   PROT 6.9 05/30/2014 1114   ALBUMIN 4.0 05/30/2014 1114   AST 12 05/30/2014 1114   ALT 13 05/30/2014 1114   ALKPHOS 57 05/30/2014 1114   BILITOT 0.3 05/30/2014 1114   GFRNONAA 67* 08/09/2012 1210   GFRAA 78* 08/09/2012 1210   INo results found for: SPEP, UPEP Lab Results  Component Value Date   WBC 5.7 08/01/2014   NEUTROABS 3.5 08/01/2014   HGB 11.8 08/01/2014   HCT 34.5* 08/01/2014   MCV 91 08/01/2014   PLT 311 08/01/2014    No results found for: LABCA2 No components found for: [id number] No results for input(s): INR in the last 168 hours.  STUDIES: [place] Venous Img Upper Uni Left  08/01/2014   CLINICAL DATA:  Prolonged LEFT arm pain and swelling, history of previous LEFT arm deep venous thrombosis question recurrence  EXAM: LEFT UPPER EXTREMITY VENOUS DOPPLER ULTRASOUND  TECHNIQUE: Gray-scale sonography with graded compression, as well as color Doppler and duplex ultrasound were performed to evaluate the upper extremity deep venous system from the level of the subclavian vein and including the jugular, axillary, basilic, radial, ulnar and upper cephalic vein.  Spectral Doppler was utilized to evaluate flow at rest and with distal augmentation maneuvers.  COMPARISON:  05/30/2014  FINDINGS: Contralateral Subclavian Vein: Respiratory phasicity is normal and symmetric with the symptomatic side. No evidence of thrombus. Normal compressibility.  Internal Jugular Vein: No evidence of thrombus. Normal compressibility, respiratory phasicity and response to augmentation.  Subclavian Vein: No evidence of thrombus. Normal compressibility, respiratory phasicity and response to augmentation.  Axillary Vein: No evidence of thrombus. Normal compressibility, respiratory phasicity and response to augmentation.  Cephalic Vein: No evidence of thrombus. Normal compressibility, respiratory phasicity and response to augmentation.  Basilic Vein: No evidence of thrombus. Normal compressibility, respiratory phasicity and response to augmentation.  Brachial Veins: No evidence of thrombus. Normal compressibility, respiratory phasicity and response to augmentation.  Radial Veins: No evidence of thrombus. Normal compressibility, respiratory phasicity and response to augmentation.  Ulnar Veins: No evidence of thrombus. Normal compressibility, respiratory phasicity and response to augmentation.  Venous Reflux:  None visualized.  Other Findings:  None visualized.   IMPRESSION: No evidence of deep venous thrombosis in LEFT upper extremity.   Electronically Signed   By: Ulyses Southward M.D.   On: 08/01/2014 11:08   ASSESSMENT/PLAN:  Ms. 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 She finished her Savaysa in December and is asymptomatic at this time.  Doppler of left arm today was negative for DVT.  We will refer her to Dr. Fatima Sanger with rheumatology for her fibromyalgia.  At this point I do not think we need to see her back. We will follow-up with her as needed.  She knows to call here with any questions or concerns and to go to the ED in the event of an emergency. We can certainly see her any time she might need [place].    Verdie Mosher, NP 08/01/2014 12:23 PM

## 2014-08-05 ENCOUNTER — Encounter: Payer: Self-pay | Admitting: Nurse Practitioner

## 2014-08-05 NOTE — Progress Notes (Signed)
Contacted pt per Maralyn SagoSarah, NP she will [facility] due to Dr.Deveshwar not taking any new patients at this time. Pt verbalized understanding and appreciation.

## 2014-08-27 ENCOUNTER — Telehealth: Payer: Self-pay | Admitting: Neurology

## 2014-08-27 MED ORDER — METOCLOPRAMIDE HCL 10 MG PO TABS: 10.0000 mg | ORAL_TABLET | Freq: Four times a day (QID) | ORAL | Status: DC | PRN

## 2014-08-27 NOTE — Telephone Encounter (Signed)
I called patient. The patient has had an average of one headache a month, but then the last several days. The headache is so she with nausea, vomiting. She may have some left facial numbness with the headache. She is still having some nausea, the headache is now gone. I will call in some Reglan for her. She may need medication such as Amerge or Frova to take when the headache comes on.

## 2014-08-27 NOTE — Telephone Encounter (Signed)
Spoke to patient and offered a 12noon appointment but she would rather have the doctor call her.  She said the migraine is causing numbness and dizziness, she is going to try and eat something and rest.  She also took an extra gabapentin hoping that would help.

## 2014-08-27 NOTE — Telephone Encounter (Signed)
Pt is calling stating that she has a migraine with pain on her left side of her face over her eye.  Last night her left side felt numb and she feels light headed. Wants to come in today to be seen.  Please call and advise.

## 2014-09-22 ENCOUNTER — Telehealth: Payer: Self-pay | Admitting: Neurology

## 2014-09-22 NOTE — Telephone Encounter (Signed)
Via answering service: patient returning call re: migraine/vomiting.

## 2014-09-22 NOTE — Telephone Encounter (Signed)
Left message with patient that I was returning her call to schedule an appointment. Asked patient to call back at her convenience.

## 2014-09-22 NOTE — Telephone Encounter (Signed)
Pt is calling stating she is having bad migraines twice a month and would like to see Dr. Anne HahnWillis soon.  I offered appointment on 09/24/14 and she could not take it.  Would like a message back from nurse.  Please advise.

## 2014-09-22 NOTE — Telephone Encounter (Signed)
Spoke to patient and scheduled appointment for 09-26-14 at 0800.

## 2014-09-26 ENCOUNTER — Encounter: Payer: Self-pay | Admitting: Neurology

## 2014-09-26 ENCOUNTER — Ambulatory Visit (INDEPENDENT_AMBULATORY_CARE_PROVIDER_SITE_OTHER): Payer: BLUE CROSS/BLUE SHIELD | Admitting: Neurology

## 2014-09-26 VITALS — BP 126/83 | HR 89 | Ht 66.0 in | Wt 194.2 lb

## 2014-09-26 DIAGNOSIS — M791 Myalgia: Secondary | ICD-10-CM | POA: Diagnosis not present

## 2014-09-26 DIAGNOSIS — M609 Myositis, unspecified: Secondary | ICD-10-CM | POA: Diagnosis not present

## 2014-09-26 DIAGNOSIS — S139XXS Sprain of joints and ligaments of unspecified parts of neck, sequela: Secondary | ICD-10-CM

## 2014-09-26 DIAGNOSIS — IMO0001 Reserved for inherently not codable concepts without codable children: Secondary | ICD-10-CM

## 2014-09-26 DIAGNOSIS — G43009 Migraine without aura, not intractable, without status migrainosus: Secondary | ICD-10-CM | POA: Diagnosis not present

## 2014-09-26 MED ORDER — NARATRIPTAN HCL 2.5 MG PO TABS: 2.5000 mg | ORAL_TABLET | Freq: Two times a day (BID) | ORAL | Status: DC | PRN

## 2014-09-26 MED ORDER — GABAPENTIN 100 MG PO CAPS: ORAL_CAPSULE | ORAL | Status: DC

## 2014-09-26 MED ORDER — TOPIRAMATE 25 MG PO TABS: ORAL_TABLET | ORAL | Status: DC

## 2014-09-26 NOTE — Progress Notes (Signed)
Reason for visit: Headache  Jennifer Henry is an 49 y.o. female  History of present illness:  [name] is a 49 year old right-handed black female with a history of migraine headache and chronic neck and low back pain. The patient is felt to have fibromyalgia. The patient is having some increase in frequency of her migraine. She is having 8 or 9 headache days a month, and her headaches generally will last 1 to 3 days in duration. The patient may have photophobia, phonophobia, nausea and vomiting. The headaches may be incapacitating at times, not allowing her to work. The patient has to get into a dark room and try to sleep. She indicates that strong odors worsen the headache, and stress and weather changes may bring on headaches. The patient also has chronic neck pain, particularly involving the right shoulder and neck. The patient also has low back pain. The patient has been placed on gabapentin, the directions are to take 200 mg 3 times daily, but the patient is only taking 300 mg at night. She takes a 4 mg tizanidine tablet at night. She also takes alprazolam at night. In the past, she has not been able to tolerate Cymbalta. She has had MRI evaluation of the brain previously that was normal. She has never had MRI evaluation of the cervical or lumbosacral spine.  Past Medical History  Diagnosis Date  . GERD (gastroesophageal reflux disease)   . Fibromyalgia   . Anxiety   . Depression   . Obesity, mild   . Migraine without aura, without mention of intractable migraine without mention of status migrainosus 03/15/2013  . Lateral epicondylitis of right elbow 03/27/2013  . DVT (deep venous thrombosis)     Past Surgical History  Procedure Laterality Date  . Abdominal hysterectomy    . Ganglion cyst resection Right     Middle finger    Family History  Problem Relation Age of Onset  . Hypertension Mother   . Diabetes Mother   . Cancer - Colon Father   . Hypertension Father   . Heart  disease Father     Social history:  reports that she has never smoked. She has never used smokeless tobacco. She reports that she does not drink alcohol or use illicit drugs.    Allergies  Allergen Reactions  . Abilify [Aripiprazole]     Hallucinations, increased depression  . Cymbalta [Duloxetine Hcl]     Hallucinations, increased depression  . Ibuprofen Other (See Comments)    Irritates stomach.  . Penicillins   . Lyrica [Pregabalin] Rash    Medications:  Prior to Admission medications   Medication Sig Start Date End Date Taking? Authorizing Provider  acetaminophen (TYLENOL) 325 MG tablet Take 650 mg by mouth every 6 (six) hours as needed.   Yes Historical Provider, MD  ALPRAZolam Prudy Feeler(XANAX) 0.5 MG tablet  07/10/13  Yes Historical Provider, MD  dexlansoprazole (DEXILANT) 60 MG capsule Take 60 mg by mouth daily.   Yes Historical Provider, MD  diphenhydrAMINE (BENADRYL) 25 MG tablet Take 25 mg by mouth every 6 (six) hours as needed.   Yes Historical Provider, MD  gabapentin (NEURONTIN) 100 MG capsule Take 2 capsules (200 mg total) by mouth 3 (three) times daily. 07/29/14  Yes York Spanielharles K Willis, MD  lidocaine (LIDODERM) 5 % Place 1 patch onto the skin daily. Remove & Discard patch within 12 hours or as directed by MD   Yes Historical Provider, MD  LINZESS 145 MCG CAPS capsule Take 145  mcg by mouth daily. 01/13/14  Yes Historical Provider, MD  metoCLOPramide (REGLAN) 10 MG tablet Take 1 tablet (10 mg total) by mouth every 6 (six) hours as needed for nausea. 08/27/14  Yes York Spaniel, MD  NONFORMULARY OR COMPOUNDED ITEM Apply topically 4 (four) times daily as needed. Compound cream for pain   Yes Historical Provider, MD  sucralfate (CARAFATE) 1 GM/10ML suspension Take 1 g by mouth 4 (four) times daily -  with meals and at bedtime.   Yes Historical Provider, MD  tiZANidine (ZANAFLEX) 4 MG capsule Take 4 mg by mouth every 6 (six) hours as needed for muscle spasms.   Yes Historical Provider, MD    traMADol (ULTRAM) 50 MG tablet Take 50 mg by mouth every 6 (six) hours as needed.   Yes Historical Provider, MD  UNABLE TO FIND Take by mouth every morning. ULTRA FLORA   Yes Historical Provider, MD  Vortioxetine HBr (BRINTELLIX) 20 MG TABS Take by mouth daily.   Yes Historical Provider, MD    ROS:  Out of a complete 14 system review of symptoms, the patient complains only of the following symptoms, and all other reviewed systems are negative.  Fatigue Light sensitivity Palpitations of the heart Swollen abdomen, diarrhea, nausea, vomiting Insomnia, snoring Joint pain, back pain, neck pain, neck stiffness Skin rash  Blood pressure 126/83, pulse 89, height  (1.676 m), weight 194 lb 3.2 oz (88.089 kg).  Physical Exam  General: The patient is alert and cooperative at the time of the examination.  Neuromuscular: The patient lacks about 30 of lateral rotation of the cervical spine to the left, 20 to the right.  Skin: No significant peripheral edema is noted.   Neurologic Exam  Mental status: The patient is alert and oriented x 3 at the time of the examination. The patient has apparent normal recent and remote memory, with an apparently normal attention span and concentration ability.   Cranial nerves: Facial symmetry is present. Speech is normal, no aphasia or dysarthria is noted. Extraocular movements are full. Visual fields are full. Pupils are equal, round, and reactive to light. Discs are flat bilaterally.  Motor: The patient has good strength in all 4 extremities.  Sensory examination: Soft touch sensation is symmetric on the face, arms, and legs.  Coordination: The patient has good finger-nose-finger and heel-to-shin bilaterally.  Gait and station: The patient has a normal gait. Tandem gait is slightly unsteady. Romberg is negative. No drift is seen.  Reflexes: Deep tendon reflexes are symmetric.   Assessment/Plan:  1. Migraine headache, worsening  2.  Fibromyalgia, chronic neck and low back pain  The patient is having worsening problems with her migraine. Topamax will be added to treat this, and the patient will be given a prescription for Amerge given the fact that her headaches last several days. The patient is to go on gabapentin taking 100 mg twice during the day, and 300 mg at night. She will take the tizanidine at night. I will set her up for MRI evaluation of the cervical spine.  Marlan Palau MD 09/26/2014 5:34 PM  [facility] [address] [place], [place] [address]  Phone [phone] Fax [phone]

## 2014-09-26 NOTE — Patient Instructions (Signed)

## 2014-09-30 DIAGNOSIS — Z0289 Encounter for other administrative examinations: Secondary | ICD-10-CM

## 2014-10-01 ENCOUNTER — Ambulatory Visit (INDEPENDENT_AMBULATORY_CARE_PROVIDER_SITE_OTHER): Payer: BLUE CROSS/BLUE SHIELD

## 2014-10-01 DIAGNOSIS — S139XXS Sprain of joints and ligaments of unspecified parts of neck, sequela: Secondary | ICD-10-CM

## 2014-10-01 DIAGNOSIS — M791 Myalgia: Secondary | ICD-10-CM

## 2014-10-01 DIAGNOSIS — G43009 Migraine without aura, not intractable, without status migrainosus: Secondary | ICD-10-CM | POA: Diagnosis not present

## 2014-10-01 DIAGNOSIS — IMO0001 Reserved for inherently not codable concepts without codable children: Secondary | ICD-10-CM

## 2014-10-01 DIAGNOSIS — M609 Myositis, unspecified: Secondary | ICD-10-CM | POA: Diagnosis not present

## 2014-10-02 ENCOUNTER — Telehealth: Payer: Self-pay | Admitting: Neurology

## 2014-10-02 NOTE — Telephone Encounter (Signed)
I called patient. The MRI of the neck shows minimal degenerative changes. Nothing that will require surgery, most of her neck pain may be related to fibromyalgia.  MRI cervical spine 10/02/2014:  IMPRESSION: This is a mildly abnormal MRI of the cervical spine showing mild degenerative disc changes at C3-C4 that do not lead to any nerve root impingement.

## 2014-10-08 ENCOUNTER — Telehealth: Payer: Self-pay | Admitting: Neurology

## 2014-10-08 NOTE — Telephone Encounter (Signed)
I called the patient. She states that none of the medications prescribed are helping and since she started taking the medications the bleeding started from her rectum. She also stated that her MRI showed arthritis and would like a referral to a doctor for her rheumatoid arthritis.

## 2014-10-08 NOTE — Telephone Encounter (Signed)
Pt is calling stating she is having pain in the back of her neck and her forehead and she states the medication Dr. Anne HahnWillis prescribed is not working, she could not tell me what the name of the medication is.  Pt also states that she has been having some bleeding in her rectum as well. Please call and advise.

## 2014-10-08 NOTE — Telephone Encounter (Signed)
I called the patient. The patient is still having headaches, she has just started Topamax, she has not yet even gone up to 2 tablets at night. The plan is to go to 75 mg at night. She needs to be patient and follow the plan for the medication ramp up.I will not alter any for medication dosing at this time. MRI of the cervical spine showed minimal degenerative changes at one level, I see no indication for a referral to a rheumatologist for rheumatoid arthritis. I have no indication that she has this diagnosis.

## 2014-10-22 ENCOUNTER — Other Ambulatory Visit: Payer: Self-pay | Admitting: Obstetrics and Gynecology

## 2014-10-24 LAB — CYTOLOGY - PAP

## 2014-10-27 ENCOUNTER — Other Ambulatory Visit: Payer: Self-pay | Admitting: Obstetrics and Gynecology

## 2014-10-27 DIAGNOSIS — R928 Other abnormal and inconclusive findings on diagnostic imaging of breast: Secondary | ICD-10-CM

## 2014-10-31 ENCOUNTER — Encounter (INDEPENDENT_AMBULATORY_CARE_PROVIDER_SITE_OTHER): Payer: Self-pay

## 2014-10-31 ENCOUNTER — Ambulatory Visit
Admission: RE | Admit: 2014-10-31 | Discharge: 2014-10-31 | Disposition: A | Discharge status: Home / Self Care | Payer: BLUE CROSS/BLUE SHIELD | Source: Ambulatory Visit | Attending: Obstetrics and Gynecology | Admitting: Obstetrics and Gynecology

## 2014-10-31 DIAGNOSIS — R928 Other abnormal and inconclusive findings on diagnostic imaging of breast: Secondary | ICD-10-CM

## 2015-01-21 ENCOUNTER — Ambulatory Visit: Payer: BLUE CROSS/BLUE SHIELD | Admitting: Neurology

## 2015-01-29 DIAGNOSIS — Z0289 Encounter for other administrative examinations: Secondary | ICD-10-CM

## 2015-05-11 ENCOUNTER — Encounter: Payer: Self-pay | Admitting: Internal Medicine

## 2015-05-13 ENCOUNTER — Ambulatory Visit: Payer: BLUE CROSS/BLUE SHIELD | Admitting: Internal Medicine

## 2015-05-18 ENCOUNTER — Telehealth: Payer: Self-pay | Admitting: Internal Medicine

## 2015-05-18 NOTE — Telephone Encounter (Signed)
Received GI records from Dr. Virginia Rochesterrr and Dr. Loreta AveMann. Patient is requesting Dr. Marina GoodellPerry. Records placed on Dr. Lamar SprinklesPerry's desk for review.

## 2015-06-10 ENCOUNTER — Encounter: Payer: Self-pay | Admitting: Internal Medicine

## 2015-06-10 NOTE — Telephone Encounter (Signed)
Dr Marina GoodellPerry agreed to see pt. Called [name] at Dr Elmyra RicksKim's office with appointment for next available. She will advice pt.

## 2015-08-12 ENCOUNTER — Ambulatory Visit: Payer: BLUE CROSS/BLUE SHIELD | Admitting: Internal Medicine

## 2015-10-29 ENCOUNTER — Other Ambulatory Visit: Payer: Self-pay | Admitting: Neurology

## 2015-12-14 ENCOUNTER — Other Ambulatory Visit: Payer: Self-pay | Admitting: Neurology

## 2016-02-08 ENCOUNTER — Telehealth: Payer: Self-pay | Admitting: Neurology

## 2016-02-08 MED ORDER — TOPIRAMATE 25 MG PO TABS: ORAL_TABLET | ORAL | 0 refills | Status: DC

## 2016-02-08 MED ORDER — METOCLOPRAMIDE HCL 10 MG PO TABS: 10.0000 mg | ORAL_TABLET | Freq: Four times a day (QID) | ORAL | 0 refills | Status: DC | PRN

## 2016-02-08 MED ORDER — GABAPENTIN 100 MG PO CAPS: ORAL_CAPSULE | ORAL | 0 refills | Status: DC

## 2016-02-08 MED ORDER — NARATRIPTAN HCL 2.5 MG PO TABS: 2.5000 mg | ORAL_TABLET | Freq: Two times a day (BID) | ORAL | 0 refills | Status: DC | PRN

## 2016-02-08 NOTE — Telephone Encounter (Signed)
Patient needs appt

## 2016-02-08 NOTE — Telephone Encounter (Signed)
Called pt and scheduled appt for Friday. RXs retailed as requested. Pt must keep appt for ongoing refills.

## 2016-02-08 NOTE — Telephone Encounter (Signed)
Pt is calling to get a Rx topiramate(TOPAMAX) 25 mg [facility] [place] [facility] [place].  Thanks!

## 2016-02-12 ENCOUNTER — Encounter: Payer: Self-pay | Admitting: Neurology

## 2016-02-12 ENCOUNTER — Ambulatory Visit (INDEPENDENT_AMBULATORY_CARE_PROVIDER_SITE_OTHER): Payer: BLUE CROSS/BLUE SHIELD | Admitting: Neurology

## 2016-02-12 VITALS — BP 128/82 | HR 80 | Ht 66.0 in | Wt 184.5 lb

## 2016-02-12 DIAGNOSIS — M791 Myalgia: Secondary | ICD-10-CM | POA: Diagnosis not present

## 2016-02-12 DIAGNOSIS — G43009 Migraine without aura, not intractable, without status migrainosus: Secondary | ICD-10-CM

## 2016-02-12 DIAGNOSIS — M609 Myositis, unspecified: Secondary | ICD-10-CM | POA: Diagnosis not present

## 2016-02-12 DIAGNOSIS — IMO0001 Reserved for inherently not codable concepts without codable children: Secondary | ICD-10-CM

## 2016-02-12 MED ORDER — PREDNISONE 5 MG PO TABS: ORAL_TABLET | ORAL | 0 refills | Status: DC

## 2016-02-12 MED ORDER — TRAZODONE HCL 50 MG PO TABS: 50.0000 mg | ORAL_TABLET | Freq: Every evening | ORAL | 1 refills | Status: DC | PRN

## 2016-02-12 MED ORDER — NARATRIPTAN HCL 2.5 MG PO TABS: 2.5000 mg | ORAL_TABLET | Freq: Two times a day (BID) | ORAL | 2 refills | Status: DC | PRN

## 2016-02-12 NOTE — Patient Instructions (Addendum)
   We will go back on Topamax, and if you do well with this after 3 weeks, start the gabapentin. We will use prednisone for the current headache, and for the next headache, try Amerge at the onset taking 2.5 mg twice daily for 3 days.  Migraine Headache A migraine headache is very bad, throbbing pain on one or both sides of your head. Talk to your doctor about what things may bring on (trigger) your migraine headaches. HOME CARE  Only take medicines as told by your doctor.  Lie down in a dark, quiet room when you have a migraine.  Keep a journal to find out if certain things bring on migraine headaches. For example, write down:  What you eat and drink.  How much sleep you get.  Any change to your diet or medicines.  Lessen how much alcohol you drink.  Quit smoking if you smoke.  Get enough sleep.  Lessen any stress in your life.  Keep lights dim if bright lights bother you or make your migraines worse. GET HELP RIGHT AWAY IF:   Your migraine becomes really bad.  You have a fever.  You have a stiff neck.  You have trouble seeing.  Your muscles are weak, or you lose muscle control.  You lose your balance or have trouble walking.  You feel like you will pass out (faint), or you pass out.  You have really bad symptoms that are different than your first symptoms. MAKE SURE YOU:   Understand these instructions.  Will watch your condition.  Will get help right away if you are not doing well or get worse.   This information is not intended to replace advice given to you by your health care provider. Make sure you discuss any questions you have with your health care provider.   Document Released: 03/29/2008 Document Revised: 09/12/2011 Document Reviewed: 02/25/2013 Elsevier Interactive Patient Education [employer]2016 [employer]

## 2016-02-12 NOTE — Progress Notes (Signed)
Reason for visit: Fibromyalgia, migraine headache  Jennifer Henry is an 50 y.o. female  History of present illness:  Ms. Mccall is a 50 year old right-handed black female with a history of migraine headache and a history of fibromyalgia. The patient has discomfort in the neck, shoulders, and back. She was last seen in March 2016, MRI of the cervical spine was done and was relatively unremarkable. The patient was felt to have significant issues with migraine, she was placed on Topamax and Amerge. The patient did well with the Topamax, she never tried Amerge. The headaches markedly reduced in frequency and severity, the patient off of her medications 2 months ago, the headaches are starting to come back and are lasting over 5 days. The patient is missing work because of the headache. The patient may have visual scotoma and zigzag lines in the vision and left face tingling prior to the onset of the headache. The patient has photophobia and phonophobia with the headache, some nausea. She indicated that when she was on gabapentin and Topamax she felt fatigued, she had difficulty concentrating at work. She returns to the office today for an evaluation. She is not sleeping well. Weather changes may bring on headaches at times.  Past Medical History:  Diagnosis Date  . Allergic rhinitis   . Anemia   . Anxiety   . Arthritis   . Depression   . Depression   . DVT (deep venous thrombosis) (HCC)   . Fibromyalgia   . GERD (gastroesophageal reflux disease)   . Hemorrhoids   . Hepatic steatosis   . Lateral epicondylitis of right elbow 03/27/2013  . Migraine without aura, without mention of intractable migraine without mention of status migrainosus 03/15/2013  . Obesity, mild   . Panic attacks   . Varicose veins     Past Surgical History:  Procedure Laterality Date  . ABDOMINAL HYSTERECTOMY    . ganglion cyst resection Right    Middle finger    Family History  Problem Relation Age of Onset    . Hypertension Mother   . Diabetes Mother   . Cancer - Colon Father   . Hypertension Father   . Heart disease Father     Social history:  reports that she has never smoked. She has never used smokeless tobacco. She reports that she does not drink alcohol or use drugs.    Allergies  Allergen Reactions  . Abilify [Aripiprazole]     Hallucinations, increased depression  . Cymbalta [Duloxetine Hcl]     Hallucinations, increased depression  . Ibuprofen Other (See Comments)    Irritates stomach.  . Penicillins   . Lyrica [Pregabalin] Rash    Medications:  Prior to Admission medications   Medication Sig Start Date End Date Taking? Authorizing Provider  acetaminophen (TYLENOL) 325 MG tablet Take 650 mg by mouth every 6 (six) hours as needed.   Yes Historical Provider, MD  ALPRAZolam Prudy Feeler) 0.5 MG tablet  07/10/13  Yes Historical Provider, MD  dexlansoprazole (DEXILANT) 60 MG capsule Take 60 mg by mouth daily.   Yes Historical Provider, MD  diphenhydrAMINE (BENADRYL) 25 MG tablet Take 25 mg by mouth every 6 (six) hours as needed.   Yes Historical Provider, MD  lidocaine (LIDODERM) 5 % Place 1 patch onto the skin daily. Remove & Discard patch within 12 hours or as directed by MD   Yes Historical Provider, MD  LINZESS 145 MCG CAPS capsule Take 145 mcg by mouth daily. 01/13/14  Yes  Historical Provider, MD  metoCLOPramide (REGLAN) 10 MG tablet Take 1 tablet (10 mg total) by mouth every 6 (six) hours as needed for nausea. 02/08/16  Yes York Spanielharles K Willis, MD  naratriptan (AMERGE) 2.5 MG tablet Take 1 tablet (2.5 mg total) by mouth 2 (two) times daily as needed for migraine. 02/08/16  Yes York Spanielharles K Willis, MD  NONFORMULARY OR COMPOUNDED ITEM Apply topically 4 (four) times daily as needed. Compound cream for pain   Yes Historical Provider, MD  sucralfate (CARAFATE) 1 GM/10ML suspension Take 1 g by mouth 4 (four) times daily -  with meals and at bedtime.   Yes Historical Provider, MD  tiZANidine  (ZANAFLEX) 4 MG capsule Take 4 mg by mouth every 6 (six) hours as needed for muscle spasms.   Yes Historical Provider, MD  traMADol (ULTRAM) 50 MG tablet Take 50 mg by mouth every 6 (six) hours as needed.   Yes Historical Provider, MD  UNABLE TO FIND Take by mouth every morning. ULTRA FLORA   Yes Historical Provider, MD  Vortioxetine HBr (BRINTELLIX) 20 MG TABS Take by mouth daily.   Yes Historical Provider, MD  gabapentin (NEURONTIN) 100 MG capsule One capsule twice a day and 3 capsules at night Patient not taking: Reported on 02/12/2016 02/08/16   York Spanielharles K Willis, MD  topiramate (TOPAMAX) 25 MG tablet Take one tablet at night for one week, then take 2 tablets at night for one week, then take 3 tablets at night. Patient not taking: Reported on 02/12/2016 02/08/16   York Spanielharles K Willis, MD    ROS:  Out of a complete 14 system review of symptoms, the patient complains only of the following symptoms, and all other reviewed systems are negative.  Fatigue Light sensitivity Dizziness, headache, weakness  Blood pressure 128/82, pulse 80, height 5\' 6"  (1.676 m), weight 184 lb 8 oz (83.7 kg).  Physical Exam  General: The patient is alert and cooperative at the time of the examination.  Skin: No significant peripheral edema is noted.   Neurologic Exam  Mental status: The patient is alert and oriented x 3 at the time of the examination. The patient has apparent normal recent and remote memory, with an apparently normal attention span and concentration ability.   Cranial nerves: Facial symmetry is present. Speech is normal, no aphasia or dysarthria is noted. Extraocular movements are full. Visual fields are full.  Motor: The patient has good strength in all 4 extremities.  Sensory examination: Soft touch sensation is symmetric on the face, arms, and legs.  Coordination: The patient has good finger-nose-finger and heel-to-shin bilaterally.  Gait and station: The patient has a normal gait. Tandem  gait is normal. Romberg is negative. No drift is seen.  Reflexes: Deep tendon reflexes are symmetric.   Assessment/Plan:  1. Migraine headache  2. Fibromyalgia  The patient will go back on Topamax, a prescription was given for Amerge again. For this current headache, the patient will be given a prednisone Dosepak, 5 mg 6 day pack. Once she gets up on the Topamax if she is doing well, she will start the gabapentin. If she starts having side effects on either one of these medications, she is to contact our office. She will follow-up in 3 months, sooner if needed. A small prescription was given for trazodone to take if needed for sleep.   Marlan Palau. Keith Willis MD 02/12/2016 12:39 PM  [facility] [address] [place], [place] [id number]  Phone [phone] Fax [phone]

## 2016-03-08 ENCOUNTER — Other Ambulatory Visit: Payer: Self-pay | Admitting: Neurology

## 2016-03-09 ENCOUNTER — Telehealth: Payer: Self-pay | Admitting: Neurology

## 2016-03-09 NOTE — Telephone Encounter (Signed)
Pt called in about prescriptions. I advised that both were sent in to [facility] last night. Also noted that she will need to make appt for further rx's. She does not understand why when she was seen 02/12/16. Please call and advise [phone]

## 2016-04-06 ENCOUNTER — Other Ambulatory Visit: Payer: Self-pay

## 2016-04-06 MED ORDER — NARATRIPTAN HCL 2.5 MG PO TABS: 2.5000 mg | ORAL_TABLET | Freq: Two times a day (BID) | ORAL | 0 refills | Status: DC | PRN

## 2016-04-06 NOTE — Progress Notes (Signed)
Rx re-sent w/ qty  #9 d/t qty limit requirements.

## 2016-04-07 MED ORDER — NARATRIPTAN HCL 2.5 MG PO TABS: 2.5000 mg | ORAL_TABLET | Freq: Two times a day (BID) | ORAL | 0 refills | Status: AC | PRN

## 2016-04-07 NOTE — Addendum Note (Signed)
Addended by: Donnelly AngelicaHOGAN, JENNIFER L on: 04/07/2016 10:09 AM   Modules accepted: Orders

## 2016-04-07 NOTE — Progress Notes (Signed)
Spoke to pharmacy and plan will only cover qty of 8. Rx changed for coverage.

## 2016-05-07 ENCOUNTER — Other Ambulatory Visit: Payer: Self-pay | Admitting: Neurology

## 2016-05-20 ENCOUNTER — Other Ambulatory Visit: Payer: Self-pay | Admitting: Neurology

## 2016-06-19 ENCOUNTER — Other Ambulatory Visit: Payer: Self-pay | Admitting: Neurology

## 2016-06-29 ENCOUNTER — Other Ambulatory Visit: Payer: Self-pay | Admitting: Neurology

## 2016-09-01 ENCOUNTER — Other Ambulatory Visit: Payer: Self-pay | Admitting: Family

## 2016-09-01 ENCOUNTER — Other Ambulatory Visit (HOSPITAL_BASED_OUTPATIENT_CLINIC_OR_DEPARTMENT_OTHER): Payer: BLUE CROSS/BLUE SHIELD

## 2016-09-01 ENCOUNTER — Ambulatory Visit (HOSPITAL_BASED_OUTPATIENT_CLINIC_OR_DEPARTMENT_OTHER): Payer: BLUE CROSS/BLUE SHIELD | Admitting: Family

## 2016-09-01 ENCOUNTER — Ambulatory Visit (HOSPITAL_BASED_OUTPATIENT_CLINIC_OR_DEPARTMENT_OTHER)
Admission: RE | Admit: 2016-09-01 | Discharge: 2016-09-01 | Disposition: A | Discharge status: Home / Self Care | Payer: BLUE CROSS/BLUE SHIELD | Source: Ambulatory Visit | Attending: Family | Admitting: Family

## 2016-09-01 VITALS — BP 114/80 | HR 71 | Temp 98.4°F | Resp 16 | Wt 181.8 lb

## 2016-09-01 DIAGNOSIS — Z86718 Personal history of other venous thrombosis and embolism: Secondary | ICD-10-CM | POA: Diagnosis not present

## 2016-09-01 DIAGNOSIS — R202 Paresthesia of skin: Secondary | ICD-10-CM

## 2016-09-01 DIAGNOSIS — M542 Cervicalgia: Secondary | ICD-10-CM | POA: Insufficient documentation

## 2016-09-01 DIAGNOSIS — M25619 Stiffness of unspecified shoulder, not elsewhere classified: Secondary | ICD-10-CM | POA: Diagnosis not present

## 2016-09-01 DIAGNOSIS — R209 Unspecified disturbances of skin sensation: Secondary | ICD-10-CM | POA: Diagnosis not present

## 2016-09-01 DIAGNOSIS — M791 Myalgia: Secondary | ICD-10-CM

## 2016-09-01 DIAGNOSIS — R200 Anesthesia of skin: Secondary | ICD-10-CM | POA: Insufficient documentation

## 2016-09-01 DIAGNOSIS — M436 Torticollis: Secondary | ICD-10-CM

## 2016-09-01 LAB — COMPREHENSIVE METABOLIC PANEL (CC13)
ALBUMIN: 4.4 g/dL (ref 3.5–5.5)
ALT: 15 IU/L (ref 0–32)
AST (SGOT): 15 IU/L (ref 0–40)
Albumin/Globulin Ratio: 1.6 (ref 1.2–2.2)
Alkaline Phosphatase, S: 68 IU/L (ref 39–117)
BILIRUBIN TOTAL: 0.2 mg/dL (ref 0.0–1.2)
BUN / CREAT RATIO: 12 (ref 9–23)
BUN: 12 mg/dL (ref 6–24)
CHLORIDE: 103 mmol/L (ref 96–106)
Calcium, Ser: 9.3 mg/dL (ref 8.7–10.2)
Carbon Dioxide, Total: 26 mmol/L (ref 18–29)
Creatinine, Ser: 0.97 mg/dL (ref 0.57–1.00)
GFR calc non Af Amer: 69 mL/min/{1.73_m2} (ref >59)
GFR, EST AFRICAN AMERICAN: 79 mL/min/{1.73_m2} (ref >59)
GLOBULIN, TOTAL: 2.8 g/dL (ref 1.5–4.5)
Glucose: 121 mg/dL — ABNORMAL HIGH (ref 65–99)
Potassium, Ser: 3.7 mmol/L (ref 3.5–5.2)
Sodium: 135 mmol/L (ref 134–144)
TOTAL PROTEIN: 7.2 g/dL (ref 6.0–8.5)

## 2016-09-01 LAB — CBC WITH DIFFERENTIAL (CANCER CENTER ONLY)
BASO#: 0 10*3/uL (ref 0.0–0.2)
BASO%: 0.4 % (ref 0.0–2.0)
EOS ABS: 0.1 10*3/uL (ref 0.0–0.5)
EOS%: 0.9 % (ref 0.0–7.0)
HCT: 38.6 % (ref 34.8–46.6)
HGB: 13.1 g/dL (ref 11.6–15.9)
LYMPH#: 2.1 10*3/uL (ref 0.9–3.3)
LYMPH%: 38.8 % (ref 14.0–48.0)
MCH: 31.4 pg (ref 26.0–34.0)
MCHC: 33.9 g/dL (ref 32.0–36.0)
MCV: 93 fL (ref 81–101)
MONO#: 0.2 10*3/uL (ref 0.1–0.9)
MONO%: 3.4 % (ref 0.0–13.0)
NEUT%: 56.5 % (ref 39.6–80.0)
NEUTROS ABS: 3.1 10*3/uL (ref 1.5–6.5)
Platelets: 290 10*3/uL (ref 145–400)
RBC: 4.17 10*6/uL (ref 3.70–5.32)
RDW: 12.7 % (ref 11.1–15.7)
WBC: 5.5 10*3/uL (ref 3.9–10.0)

## 2016-09-01 NOTE — Progress Notes (Signed)
Hematology and Oncology Follow Up Visit  Jennifer Henry [id number] [birth year] 51 y.o. 09/01/2016   Principle Diagnosis:  1. 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 Severe fibromyalgia  Current Therapy:   Observation    Interim History: [name] is here today with c/o pain/stiffness in her left neck, shoulder and arm. She has also had some numbness and tingling in her left arm. No swelling in her extremities. Radial and pedal pulses are +2.  She has taken a baby aspirin off and on for the pain.  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 She is followed by rheumatology.  No fever, chills, n/v, cough, rash, dizziness, SOB, chest pain, palpitations, abdominal pain or changes in bowel or bladder habits. No episodes of bleeding, bruising or petechiae. No lymphadenopathy.  She has maintained a good appetite and stayed well hydrated. Her weight is stable.   Medications:  Allergies as of 09/01/2016      Reactions   Abilify [aripiprazole]    Hallucinations, increased depression   Cymbalta [duloxetine Hcl]    Hallucinations, increased depression   Ibuprofen Other (See Comments)   Irritates stomach.   Penicillins    Lyrica [pregabalin] Rash      Medication List       Accurate as of 09/01/16  3:07 PM. Always use your most recent med list.          ALPRAZolam 0.5 MG tablet Commonly known as:  XANAX   BENADRYL 25 MG tablet Generic drug:  diphenhydrAMINE Take 25 mg by mouth every 6 (six) hours as needed.   BRINTELLIX 20 MG Tabs Generic drug:  vortioxetine HBr Take by mouth daily.   CARAFATE 1 GM/10ML suspension Generic drug:  sucralfate Take 1 g by mouth 4 (four) times daily -  with meals and at bedtime.   DEXILANT 60 MG capsule Generic drug:  dexlansoprazole Take 60 mg by mouth daily.   gabapentin 100 MG capsule Commonly known as:  NEURONTIN TAKE ONE  CAPSULE TWICE A DAY AND 3 CAPSULES AT NIGHT   lidocaine 5 % Commonly known as:  LIDODERM Place 1 patch onto the skin daily. Remove & Discard patch within 12 hours or as directed by MD   LINZESS 145 MCG Caps capsule Generic drug:  linaclotide Take 145 mcg by mouth daily.   metoCLOPramide 10 MG tablet Commonly known as:  REGLAN Take 1 tablet (10 mg total) by mouth every 6 (six) hours as needed for nausea.   naratriptan 2.5 MG tablet Commonly known as:  AMERGE Take 1 tablet (2.5 mg total) by mouth 2 (two) times daily as needed for migraine.   NONFORMULARY OR COMPOUNDED ITEM Apply topically 4 (four) times daily as needed. Compound cream for pain   predniSONE 5 MG tablet Commonly known as:  DELTASONE Begin taking 6 tablets daily, taper by one tablet daily until off the medication.   tiZANidine 4 MG capsule Commonly known as:  ZANAFLEX Take 4 mg by mouth every 6 (six) hours as needed for muscle spasms.   topiramate 25 MG tablet Commonly known as:  TOPAMAX Take 3 tablets (75 mg total) by mouth at bedtime.   traMADol 50 MG tablet Commonly known as:  ULTRAM Take 50 mg by mouth every 6 (six) hours as needed.   traZODone 50 MG tablet Commonly known as:  DESYREL TAKE 1 TABLET (50 MG TOTAL) BY MOUTH AT BEDTIME AS NEEDED FOR  SLEEP.   TYLENOL 325 MG tablet Generic drug:  acetaminophen Take 650 mg by mouth every 6 (six) hours as needed.   UNABLE TO FIND Take by mouth every morning. ULTRA FLORA       Allergies:  Allergies  Allergen Reactions  . Abilify [Aripiprazole]     Hallucinations, increased depression  . Cymbalta [Duloxetine Hcl]     Hallucinations, increased depression  . Ibuprofen Other (See Comments)    Irritates stomach.  . Penicillins   . Lyrica [Pregabalin] Rash    Past Medical History, Surgical history, Social history, and Family History were reviewed and updated.  Review of Systems: All other 10 point review of systems is negative.   Physical Exam:   vitals were not taken for this visit.  Wt Readings from Last 3 Encounters:  02/12/16 184 lb 8 oz (83.7 kg)  09/30/14 194 lb (88 kg)  09/26/14 194 lb 3.2 oz (88.1 kg)    Ocular: Sclerae unicteric, pupils equal, round and reactive to light Ear-nose-throat: Oropharynx clear, dentition fair Lymphatic: No cervical supraclavicular or axillary adenopathy Lungs no rales or rhonchi, good excursion bilaterally Heart regular rate and rhythm, no murmur appreciated Abd soft, nontender, positive bowel sounds, no liver or spleen tip palpated on exam, no fluid wave MSK no focal spinal tenderness, no joint edema Neuro: non-focal, well-oriented, appropriate affect Breasts: Deferred  Lab Results  Component Value Date   WBC 5.7 08/01/2014   HGB 11.8 08/01/2014   HCT 34.5 (L) 08/01/2014   MCV 91 08/01/2014   PLT 311 08/01/2014   No results found for: FERRITIN, IRON, TIBC, UIBC, IRONPCTSAT Lab Results  Component Value Date   RBC 3.78 08/01/2014   No results found for: KPAFRELGTCHN, LAMBDASER, KAPLAMBRATIO No results found for: IGGSERUM, IGA, IGMSERUM No results found for: Dorene Ar, A1GS, Emmit Alexanders, SPEI   Chemistry      Component Value Date/Time   NA 139 08/01/2014 1111   K 4.1 08/01/2014 1111   CL 106 08/01/2014 1111   CO2 27 08/01/2014 1111   BUN 10 08/01/2014 1111   CREATININE 0.94 08/01/2014 1111      Component Value Date/Time   CALCIUM 9.0 08/01/2014 1111   ALKPHOS 63 08/01/2014 1111   AST 26 08/01/2014 1111   ALT 30 08/01/2014 1111   BILITOT 0.4 08/01/2014 1111     Impression and Plan: 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 She finished her Savaysa in December 2015. She is here today with c/o pain/stiffness in her left neck, shoulder and arm. She has also had some numbness and tingling in her left arm. We will get an US of the left arm and xray of the cervical spine today  to assess for cause. 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 Fatima Sanger with rheumatology.  We will see what her scans show and follow-up as needed.  She will contact our office with any questions or concerns and to go to the ED in the event of an emergency. We can certainly see her any time she might need [place].    Verdie Mosher, NP 3/1/20183:07 PM

## 2016-09-03 ENCOUNTER — Ambulatory Visit (HOSPITAL_BASED_OUTPATIENT_CLINIC_OR_DEPARTMENT_OTHER)
Admission: RE | Admit: 2016-09-03 | Discharge: 2016-09-03 | Disposition: A | Discharge status: Home / Self Care | Payer: BLUE CROSS/BLUE SHIELD | Source: Ambulatory Visit | Attending: Family | Admitting: Family

## 2016-09-03 DIAGNOSIS — R202 Paresthesia of skin: Secondary | ICD-10-CM | POA: Insufficient documentation

## 2016-09-03 DIAGNOSIS — Z86718 Personal history of other venous thrombosis and embolism: Secondary | ICD-10-CM | POA: Diagnosis not present

## 2016-09-03 DIAGNOSIS — R200 Anesthesia of skin: Secondary | ICD-10-CM | POA: Diagnosis not present

## 2016-09-03 DIAGNOSIS — M542 Cervicalgia: Secondary | ICD-10-CM | POA: Diagnosis not present

## 2016-09-05 ENCOUNTER — Telehealth: Payer: Self-pay | Admitting: *Deleted

## 2016-09-05 ENCOUNTER — Ambulatory Visit (HOSPITAL_BASED_OUTPATIENT_CLINIC_OR_DEPARTMENT_OTHER): Payer: BLUE CROSS/BLUE SHIELD

## 2016-09-05 NOTE — Telephone Encounter (Addendum)
Patient is aware of results   ----- Message from Verdie MosherSarah M [place], NP sent at 09/02/2016 12:12 PM EST ----- Regarding: Xray cervical spine Xray of cervical spine was negative. We will see what her US on 3/5 shows. Thank you!   Verdie MosherSarah M [place], NP  P Onc Nurse Hp        No DVT!!! Thank you!

## 2018-06-28 ENCOUNTER — Other Ambulatory Visit: Payer: Self-pay | Admitting: Obstetrics and Gynecology

## 2018-06-28 DIAGNOSIS — N644 Mastodynia: Secondary | ICD-10-CM

## 2018-07-05 ENCOUNTER — Ambulatory Visit
Admission: RE | Admit: 2018-07-05 | Discharge: 2018-07-05 | Disposition: A | Discharge status: Home / Self Care | Payer: BLUE CROSS/BLUE SHIELD | Source: Ambulatory Visit | Attending: Obstetrics and Gynecology | Admitting: Obstetrics and Gynecology

## 2018-07-05 ENCOUNTER — Other Ambulatory Visit: Payer: BLUE CROSS/BLUE SHIELD

## 2018-07-05 DIAGNOSIS — N644 Mastodynia: Secondary | ICD-10-CM

## 2018-07-10 ENCOUNTER — Other Ambulatory Visit: Payer: Self-pay

## 2019-05-27 ENCOUNTER — Ambulatory Visit (INDEPENDENT_AMBULATORY_CARE_PROVIDER_SITE_OTHER): Payer: BC Managed Care – PPO

## 2019-05-27 ENCOUNTER — Ambulatory Visit (INDEPENDENT_AMBULATORY_CARE_PROVIDER_SITE_OTHER): Payer: BC Managed Care – PPO | Admitting: Orthopedic Surgery

## 2019-05-27 ENCOUNTER — Ambulatory Visit: Payer: Self-pay

## 2019-05-27 ENCOUNTER — Other Ambulatory Visit: Payer: Self-pay

## 2019-05-27 ENCOUNTER — Encounter: Payer: Self-pay | Admitting: Orthopedic Surgery

## 2019-05-27 DIAGNOSIS — M5410 Radiculopathy, site unspecified: Secondary | ICD-10-CM

## 2019-05-27 DIAGNOSIS — M545 Low back pain: Secondary | ICD-10-CM | POA: Diagnosis not present

## 2019-05-27 DIAGNOSIS — M79602 Pain in left arm: Secondary | ICD-10-CM

## 2019-05-27 MED ORDER — LIDOCAINE 5 % EX PTCH: 1.0000 | MEDICATED_PATCH | Freq: Every day | CUTANEOUS | 0 refills | Status: DC

## 2019-05-29 ENCOUNTER — Encounter: Payer: Self-pay | Admitting: Orthopedic Surgery

## 2019-05-29 NOTE — Progress Notes (Signed)
Office Visit Note   Patient: Jennifer Henry           Date of Birth: [birth year]           MRN: [id number] Visit Date: 05/27/2019 Requested by: Jani Gravel, [address],  [place] [address] PCP: Jani Gravel, MD  Subjective: Chief Complaint  Patient presents with  . Neck - Pain  . Lower Back - Pain    HPI: Jennifer Henry is a patient with left leg pain.  Is been going on for a year.  Reports radicular type pain with some groin pain and numbness and tingling in the left foot.  She also describes low back pain.  She tried heat and that did not help.  She also reports some occasional numbness and tingling in the left upper extremity.  The back pain is her primary concern.  Hard for her to sleep at night.  The pain radiates below the leg to the lateral calf with numbness as well.  She is had tingling in both feet off and on throughout the year.  Standing is more symptomatic than sitting.  She does have a second job and does more standing.  She is tried Tylenol and ibuprofen without much relief.  She states that her left leg is weak.              ROS: All systems reviewed are negative as they relate to the chief complaint within the history of present illness.  Patient denies  fevers or chills.   Assessment & Plan: Visit Diagnoses:  1. Radicular leg pain   2. Left arm pain     Plan: Impression is left leg pain with radicular component.  Not much groin pain with internal X rotation of the leg today.  I would to try lidocaine patches which have helped her in the past as well as MRI lumbar spine to evaluate left-sided radiculopathy.  See her back after that study.  Follow-Up Instructions: Return for after MRI.   Orders:  Orders Placed This Encounter  Procedures  . XR Cervical Spine 2 or 3 views  . XR Lumbar Spine 2-3 Views  . MR Lumbar Spine w/o contrast   Meds ordered this encounter  Medications  . lidocaine (LIDODERM) 5 %    Sig: Place 1 patch onto the skin daily.  Remove & Discard patch within 12 hours or as directed by MD    Dispense:  30 patch    Refill:  0      Procedures: No procedures performed   Clinical Data: No additional findings.  Objective: Vital Signs: There were no vitals taken for this visit.  Physical Exam:   Constitutional: Patient appears well-developed HEENT:  Head: Normocephalic Eyes:EOM are normal Neck: Normal range of motion Cardiovascular: Normal rate Pulmonary/chest: Effort normal Neurologic: Patient is alert Skin: Skin is warm Psychiatric: Patient has normal mood and affect    Ortho Exam: Ortho exam demonstrates pretty reasonable cervical spine range of motion with 5 out of 5 grip EPL FPL interosseous wrist flexion extension bicep triceps and deltoid strength.  She has palpable radial pulses bilaterally no definite paresthesias C5-T1.  Regarding left lower extremity she has mildly positive nerve root tension signs on the left negative on the right.  Good ankle dorsiflexion plantarflexion quad hamstring strength with palpable pedal pulses.  No masses lymphadenopathy or skin changes noted in the bilateral leg region.  She has symmetric reflexes.  Negative clonus.  Definite paresthesias L5 distribution  left versus right.  No muscle atrophy in either leg.  Specialty Comments:  No specialty comments available.  Imaging: No results found.   PMFS History: Patient Active Problem List   Diagnosis Date Noted  . Facial pain 08/02/2013  . Late effect of injury 04/09/2013  . Skin inflammation 04/09/2013  . Lateral epicondylitis of right elbow 03/27/2013  . Migraine without aura 03/15/2013  . Sprain of neck 11/13/2012  . Myalgia and myositis 03/20/2012  . Hypersomnia, unspecified 03/20/2012  . Other general symptoms(780.99) 03/20/2012  . Disturbance of skin sensation 03/20/2012  . 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 Scarring alopecia 05/23/2011   Past Medical History:  Diagnosis Date  .  Allergic rhinitis   . Anemia   . Anxiety   . Arthritis   . Depression   . Depression   . DVT (deep venous thrombosis) (HCC)   . Fibromyalgia   . GERD (gastroesophageal reflux disease)   . Hemorrhoids   . Hepatic steatosis   . Lateral epicondylitis of right elbow 03/27/2013  . Migraine without aura, without mention of intractable migraine without mention of status migrainosus 03/15/2013  . Obesity, mild   . Panic attacks   . Varicose veins     Family History  Problem Relation Age of Onset  . Hypertension Mother   . Diabetes Mother   . Cancer - Colon Father   . Hypertension Father   . Heart disease Father     Past Surgical History:  Procedure Laterality Date  . ABDOMINAL HYSTERECTOMY    . BREAST EXCISIONAL BIOPSY Right   . ganglion cyst resection Right    Middle finger   Social History   Occupational History    Employer: [employer]  Tobacco Use  . Smoking status: Never Smoker  . Smokeless tobacco: Never Used  . Tobacco comment: never used tobacco  Substance and Sexual Activity  . Alcohol use: No    Comment: Social  . Drug use: No  . Sexual activity: Not on file

## 2019-06-02 ENCOUNTER — Ambulatory Visit (INDEPENDENT_AMBULATORY_CARE_PROVIDER_SITE_OTHER): Payer: BC Managed Care – PPO

## 2019-06-02 ENCOUNTER — Other Ambulatory Visit: Payer: Self-pay

## 2019-06-02 DIAGNOSIS — M5410 Radiculopathy, site unspecified: Secondary | ICD-10-CM | POA: Diagnosis not present

## 2019-08-07 ENCOUNTER — Other Ambulatory Visit: Payer: Self-pay | Admitting: Obstetrics and Gynecology

## 2019-08-07 DIAGNOSIS — R928 Other abnormal and inconclusive findings on diagnostic imaging of breast: Secondary | ICD-10-CM

## 2019-09-02 ENCOUNTER — Other Ambulatory Visit: Payer: Self-pay | Admitting: Obstetrics and Gynecology

## 2019-09-02 ENCOUNTER — Ambulatory Visit
Admission: RE | Admit: 2019-09-02 | Discharge: 2019-09-02 | Disposition: A | Discharge status: Home / Self Care | Payer: BC Managed Care – PPO | Source: Ambulatory Visit | Attending: Obstetrics and Gynecology | Admitting: Obstetrics and Gynecology

## 2019-09-02 ENCOUNTER — Other Ambulatory Visit: Payer: Self-pay

## 2019-09-02 DIAGNOSIS — R928 Other abnormal and inconclusive findings on diagnostic imaging of breast: Secondary | ICD-10-CM

## 2019-09-02 DIAGNOSIS — R921 Mammographic calcification found on diagnostic imaging of breast: Secondary | ICD-10-CM

## 2019-09-09 ENCOUNTER — Ambulatory Visit
Admission: RE | Admit: 2019-09-09 | Discharge: 2019-09-09 | Disposition: A | Discharge status: Home / Self Care | Payer: BC Managed Care – PPO | Source: Ambulatory Visit | Attending: Obstetrics and Gynecology | Admitting: Obstetrics and Gynecology

## 2019-09-09 ENCOUNTER — Other Ambulatory Visit: Payer: Self-pay | Admitting: Obstetrics and Gynecology

## 2019-09-09 ENCOUNTER — Other Ambulatory Visit: Payer: Self-pay

## 2019-09-09 DIAGNOSIS — R921 Mammographic calcification found on diagnostic imaging of breast: Secondary | ICD-10-CM

## 2019-09-09 HISTORY — PX: BREAST BIOPSY: SHX20

## 2020-04-08 ENCOUNTER — Other Ambulatory Visit: Payer: Self-pay | Admitting: Surgery

## 2020-04-08 DIAGNOSIS — N6092 Unspecified benign mammary dysplasia of left breast: Secondary | ICD-10-CM

## 2020-05-14 ENCOUNTER — Other Ambulatory Visit: Payer: BC Managed Care – PPO

## 2020-06-30 ENCOUNTER — Other Ambulatory Visit: Payer: BC Managed Care – PPO

## 2020-07-25 ENCOUNTER — Other Ambulatory Visit: Payer: BC Managed Care – PPO

## 2020-09-09 ENCOUNTER — Ambulatory Visit: Payer: Self-pay | Admitting: Orthopedic Surgery

## 2020-09-14 ENCOUNTER — Ambulatory Visit: Payer: Self-pay | Admitting: Orthopedic Surgery

## 2020-09-19 ENCOUNTER — Other Ambulatory Visit: Payer: Self-pay

## 2020-10-17 ENCOUNTER — Other Ambulatory Visit: Payer: BC Managed Care – PPO

## 2021-02-23 ENCOUNTER — Ambulatory Visit (INDEPENDENT_AMBULATORY_CARE_PROVIDER_SITE_OTHER): Payer: Managed Care, Other (non HMO) | Admitting: Family Medicine

## 2021-02-23 ENCOUNTER — Encounter: Payer: Self-pay | Admitting: Family Medicine

## 2021-02-23 ENCOUNTER — Other Ambulatory Visit: Payer: Self-pay

## 2021-02-23 DIAGNOSIS — M25522 Pain in left elbow: Secondary | ICD-10-CM | POA: Diagnosis not present

## 2021-02-23 MED ORDER — DICLOFENAC SODIUM 1 % EX GEL: 4.0000 g | Freq: Four times a day (QID) | CUTANEOUS | 6 refills | Status: AC | PRN

## 2021-02-23 MED ORDER — TRAMADOL HCL 50 MG PO TABS: 50.0000 mg | ORAL_TABLET | Freq: Every evening | ORAL | 0 refills | Status: DC | PRN

## 2021-02-23 NOTE — Progress Notes (Signed)
Office Visit Note   Patient: Jennifer Henry           Date of Birth: [birth year]           MRN: [id number] Visit Date: 02/23/2021 Requested by: Pearson Grippe, MD [address] [place],  [place] [address] PCP: Pearson Grippe, MD  Subjective: Chief Complaint  Patient presents with   Left Elbow - Pain    Pain in the lateral aspect of the elbow x 3 months. NKI. Has been having issues with neck pain for longer than these 3 months, that is moving to the left side of the neck. Numbness in the fingers of the left hand this morning. The pain is concentrated in the elbow.  Her PCP started cyclobenzaprine and diclofenac. Only help her to sleep. Tried ice and heat last night, without relief. Right-hand dominant. H/o sx for tennis elbow on the right, by Dr. August Saucer.    HPI: She is here with neck and left arm pain.  Symptoms started in the lateral elbow about 3 months ago, but in the past couple weeks her neck has started hurting.  She went to her PCP who gave Flexeril and diclofenac which helped her sleep but they do not help during the day.  The pain is constant and throbbing.  She has had some tingling in her third finger.  She has a history of right elbow lateral epicondylitis treated surgically per Dr. August Saucer years ago.  She did well with that procedure.  She works 2 jobs and is finding it difficult to do her work.  Her job at [employer] requires repetitive lifting sometimes.                ROS:   All other systems were reviewed and are negative.  Objective: Vital Signs: There were no vitals taken for this visit.  Physical Exam:  General:  Alert and oriented, in no acute distress. Pulm:  Breathing unlabored. Psy:  Normal mood, congruent affect. Skin: No rash Left arm: She has tenderness in the left cervical paraspinous muscles and trapezius, as well as the rhomboid region.  Multiple tender trigger points.  Upper extremity strength and reflexes are still normal.  Slight tenderness at the  lateral epicondyle common extensor tendon.  No elbow effusion.  No pain with wrist extension, third finger extension or for forearm pronation/supination.   Imaging: No results found.  Assessment & Plan: Neck and left arm pain, suspect cervical radiculopathy -We will try physical therapy.  Cervical MRI if pain persists.     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   Scarring alopecia 05/23/2011   Past Medical History:  Diagnosis Date   Allergic rhinitis    Anemia    Anxiety    Arthritis    Depression    Depression    DVT (deep venous thrombosis) (HCC)    Fibromyalgia    GERD (gastroesophageal reflux disease)    Hemorrhoids    Hepatic steatosis    Lateral epicondylitis of  right elbow 03/27/2013   Migraine without aura, without mention of intractable migraine without mention of status migrainosus 03/15/2013   Obesity, mild    Panic attacks    Varicose veins     Family History  Problem Relation Age of Onset   Hypertension Mother    Diabetes Mother    Cancer - Colon Father    Hypertension Father    Heart disease Father     Past Surgical History:  Procedure Laterality Date   ABDOMINAL HYSTERECTOMY     BREAST EXCISIONAL BIOPSY Right    ganglion cyst resection Right    Middle finger   Social History   Occupational History    Employer: [employer]  Tobacco Use   Smoking status: Never   Smokeless tobacco: Never   Tobacco comments:    never used tobacco  Substance and Sexual  Activity   Alcohol use: No    Comment: Social   Drug use: No   Sexual activity: Not on file

## 2021-02-24 ENCOUNTER — Other Ambulatory Visit: Payer: Self-pay | Admitting: Family Medicine

## 2021-02-24 MED ORDER — TRAMADOL HCL 50 MG PO TABS: 50.0000 mg | ORAL_TABLET | Freq: Four times a day (QID) | ORAL | 0 refills | Status: AC | PRN

## 2021-02-25 DIAGNOSIS — M2569 Stiffness of other specified joint, not elsewhere classified: Secondary | ICD-10-CM | POA: Diagnosis not present

## 2021-02-25 DIAGNOSIS — M542 Cervicalgia: Secondary | ICD-10-CM | POA: Diagnosis not present

## 2021-02-25 DIAGNOSIS — M5412 Radiculopathy, cervical region: Secondary | ICD-10-CM | POA: Diagnosis not present

## 2021-02-25 DIAGNOSIS — M6281 Muscle weakness (generalized): Secondary | ICD-10-CM | POA: Diagnosis not present

## 2021-07-19 DIAGNOSIS — Z6834 Body mass index (BMI) 34.0-34.9, adult: Secondary | ICD-10-CM | POA: Diagnosis not present

## 2021-07-19 DIAGNOSIS — Z01419 Encounter for gynecological examination (general) (routine) without abnormal findings: Secondary | ICD-10-CM | POA: Diagnosis not present

## 2021-07-29 ENCOUNTER — Other Ambulatory Visit: Payer: Self-pay | Admitting: Obstetrics and Gynecology

## 2021-07-29 DIAGNOSIS — Z9889 Other specified postprocedural states: Secondary | ICD-10-CM

## 2021-08-17 ENCOUNTER — Ambulatory Visit
Admission: RE | Admit: 2021-08-17 | Discharge: 2021-08-17 | Disposition: A | Discharge status: Home / Self Care | Payer: 59 | Source: Ambulatory Visit | Attending: Obstetrics and Gynecology | Admitting: Obstetrics and Gynecology

## 2021-08-17 DIAGNOSIS — Z9889 Other specified postprocedural states: Secondary | ICD-10-CM

## 2021-08-19 ENCOUNTER — Other Ambulatory Visit: Payer: Self-pay | Admitting: Obstetrics and Gynecology

## 2021-08-19 DIAGNOSIS — N62 Hypertrophy of breast: Secondary | ICD-10-CM

## 2021-08-19 DIAGNOSIS — N6099 Unspecified benign mammary dysplasia of unspecified breast: Secondary | ICD-10-CM

## 2021-08-21 ENCOUNTER — Other Ambulatory Visit: Payer: 59

## 2021-08-23 DIAGNOSIS — G8929 Other chronic pain: Secondary | ICD-10-CM | POA: Diagnosis not present

## 2021-08-23 DIAGNOSIS — Z833 Family history of diabetes mellitus: Secondary | ICD-10-CM | POA: Diagnosis not present

## 2021-08-23 DIAGNOSIS — R928 Other abnormal and inconclusive findings on diagnostic imaging of breast: Secondary | ICD-10-CM | POA: Diagnosis not present

## 2021-08-23 DIAGNOSIS — R7989 Other specified abnormal findings of blood chemistry: Secondary | ICD-10-CM | POA: Diagnosis not present

## 2021-08-23 DIAGNOSIS — R69 Illness, unspecified: Secondary | ICD-10-CM | POA: Diagnosis not present

## 2021-08-23 DIAGNOSIS — J302 Other seasonal allergic rhinitis: Secondary | ICD-10-CM | POA: Diagnosis not present

## 2021-08-23 DIAGNOSIS — Z7689 Persons encountering health services in other specified circumstances: Secondary | ICD-10-CM | POA: Diagnosis not present

## 2021-08-23 DIAGNOSIS — G43009 Migraine without aura, not intractable, without status migrainosus: Secondary | ICD-10-CM | POA: Diagnosis not present

## 2021-08-23 DIAGNOSIS — Z8249 Family history of ischemic heart disease and other diseases of the circulatory system: Secondary | ICD-10-CM | POA: Diagnosis not present

## 2021-08-23 DIAGNOSIS — M792 Neuralgia and neuritis, unspecified: Secondary | ICD-10-CM | POA: Diagnosis not present

## 2021-10-09 ENCOUNTER — Ambulatory Visit
Admission: RE | Admit: 2021-10-09 | Discharge: 2021-10-09 | Disposition: A | Discharge status: Home / Self Care | Payer: 59 | Source: Ambulatory Visit | Attending: Obstetrics and Gynecology | Admitting: Obstetrics and Gynecology

## 2021-10-09 DIAGNOSIS — N6099 Unspecified benign mammary dysplasia of unspecified breast: Secondary | ICD-10-CM

## 2021-10-09 DIAGNOSIS — N62 Hypertrophy of breast: Secondary | ICD-10-CM

## 2021-10-09 MED ORDER — GADOBUTROL 1 MMOL/ML IV SOLN
10.0000 mL | Freq: Once | INTRAVENOUS | Status: AC | PRN
  Administered 2021-10-09: 10 mL via INTRAVENOUS

## 2021-10-29 DIAGNOSIS — M791 Myalgia, unspecified site: Secondary | ICD-10-CM | POA: Diagnosis not present

## 2022-04-08 DIAGNOSIS — Z23 Encounter for immunization: Secondary | ICD-10-CM | POA: Diagnosis not present

## 2022-04-18 DIAGNOSIS — Z1239 Encounter for other screening for malignant neoplasm of breast: Secondary | ICD-10-CM | POA: Diagnosis not present

## 2022-04-18 DIAGNOSIS — N6082 Other benign mammary dysplasias of left breast: Secondary | ICD-10-CM | POA: Diagnosis not present

## 2022-04-29 DIAGNOSIS — E559 Vitamin D deficiency, unspecified: Secondary | ICD-10-CM | POA: Diagnosis not present

## 2022-07-04 IMAGING — MR MR BREAST BILAT WO/W CM
8 of 12 series · 32 of 48 positions shown · IV contrast (gadavist)
Comparison: Prior mammograms including the most recent diagnostic
study dated 08/17/2021.

CLINICAL DATA: 55-year-old female with biopsy proven atypical
lobular hyperplasia in the left breast upper-outer anterior (coil
shaped clip). Patient also had a benign biopsy of the left breast in
the upper-outer posterior showing fibrocystic change with
calcifications and focal adenosis, pseudoangiomatous stromal
hyperplasia (X shaped clip). The patient's biopsy was performed on
09/11/2019. Patient had a surgical consultation but has not had
surgical excision.

EXAM:
BILATERAL BREAST MRI WITH AND WITHOUT CONTRAST
TECHNIQUE: Multiplanar, multisequence MR images of both breasts were obtained
prior to and following the intravenous administration of 10 ml of
Gadavist

[Series 2: t2_tirm_tra ipat (a-p) · axial · 3.0mm · 0.74mm/px · 1 of 55 slices shown]
[im 1/55]
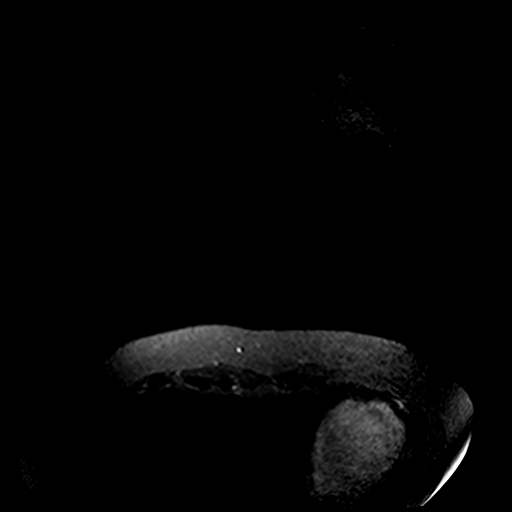

[Series 3: fl3d pre-cm no · axial · non-contrast · 1.2mm · 0.94mm/px · z∈[-126,+46]mm · 5 of 144 slices shown]
[im 1/144]
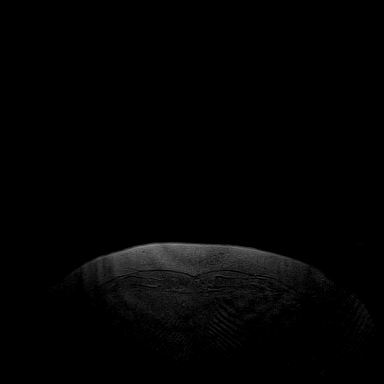
[im 36/144]
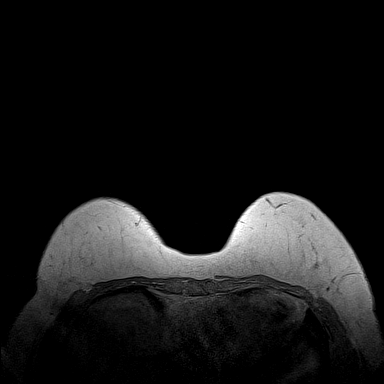
[im 72/144]
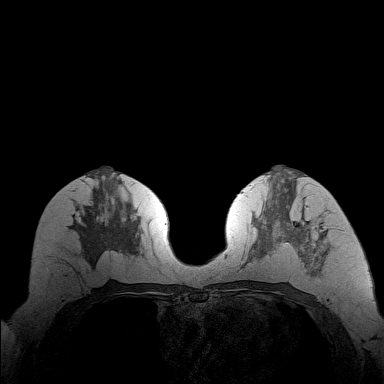
[im 108/144]
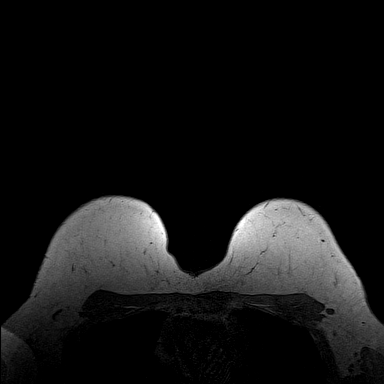
[im 144/144]
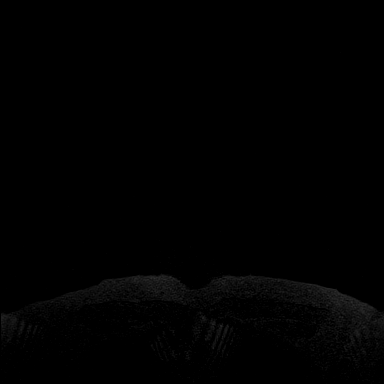

[Series 4: fl3d pre-cm · axial · non-contrast · 1.2mm · 0.94mm/px · z∈[-126,+46]mm · 5 of 144 slices shown]
[im 1/144]
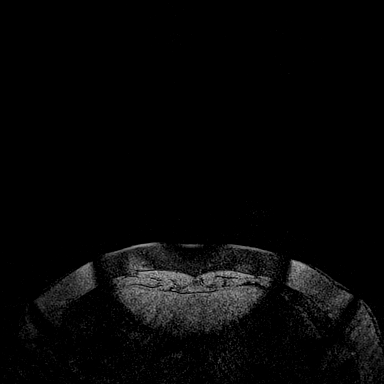
[im 36/144]
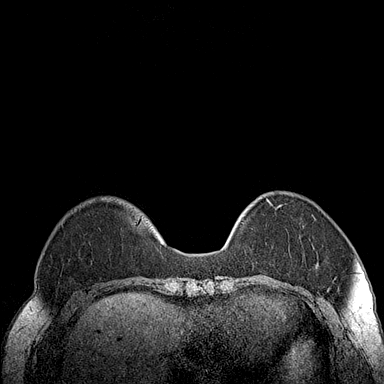
[im 72/144]
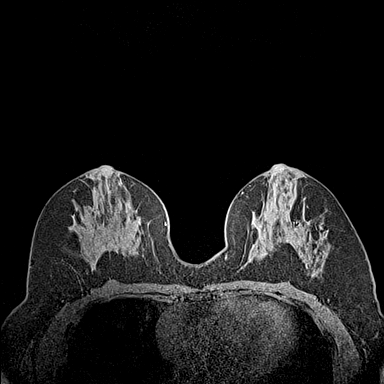
[im 108/144]
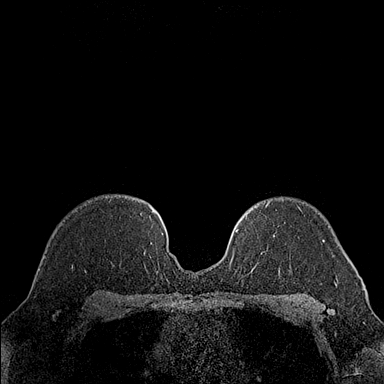
[im 144/144]
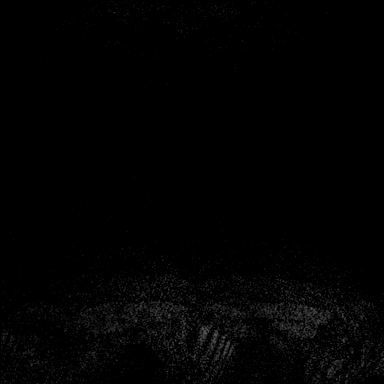

[Series 5: fl3d post-cm 20 · axial · 1.2mm · 0.94mm/px · z∈[-126,+46]mm · 5 of 144 slices shown (1 of 3)]
[im 1/144]
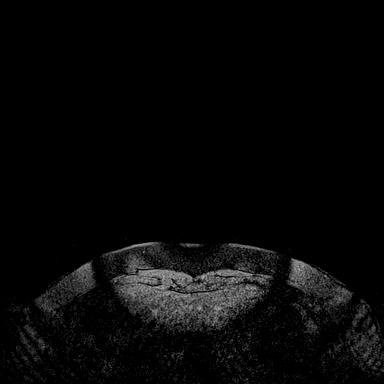
[im 36/144]
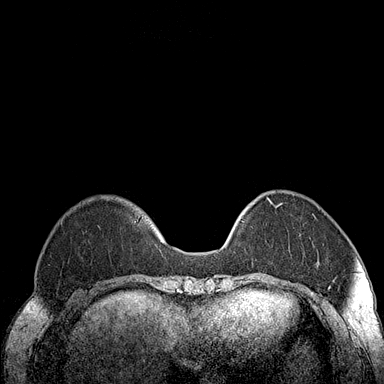
[im 72/144]
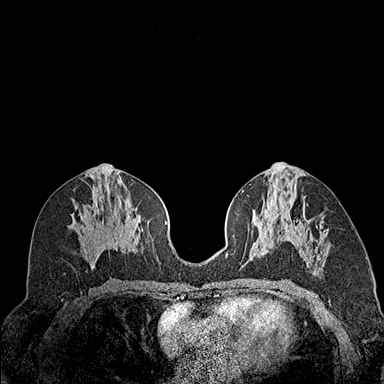
[im 108/144]
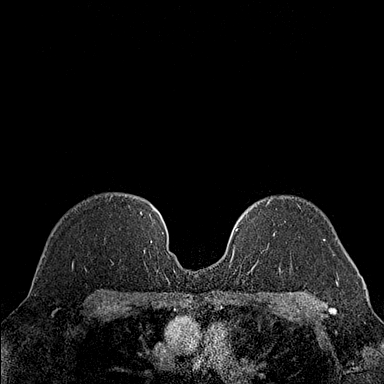
[im 144/144]
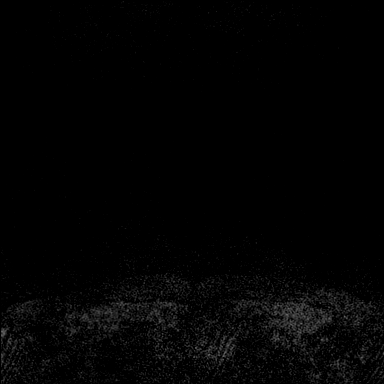

[Series 6: fl3d post-cm 20 · axial · 1.2mm · 0.94mm/px · z∈[-126,+46]mm · 5 of 144 slices shown (2 of 3)]
[im 1/144]
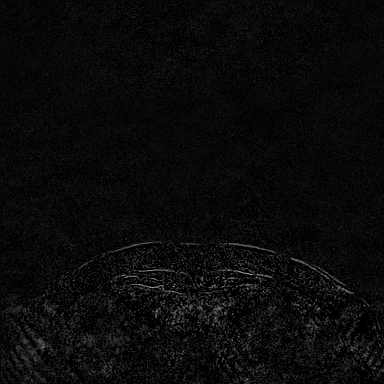
[im 36/144]
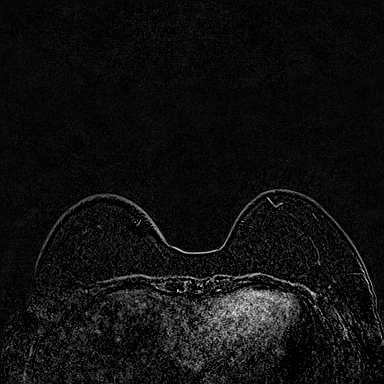
[im 72/144]
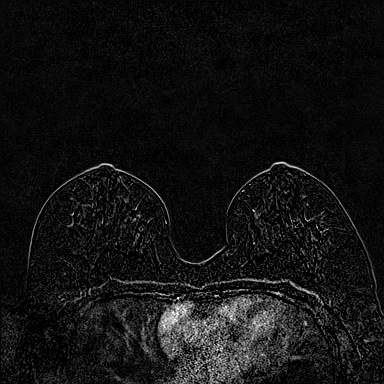
[im 108/144]
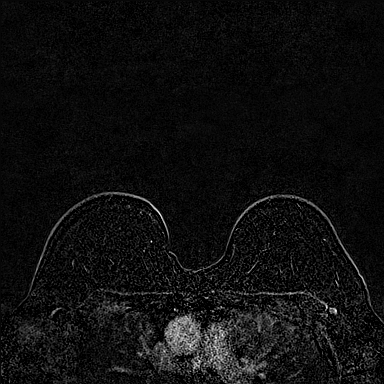
[im 144/144]
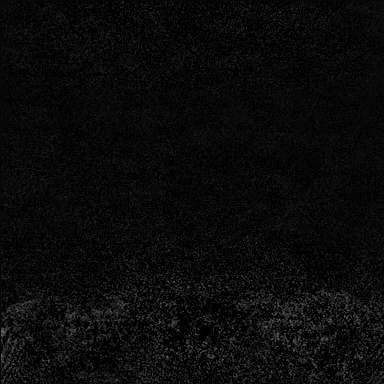

[Series 7: fl3d post-cm 20 · axial · 172.8mm · 0.94mm/px · 1 of 1 slices shown (3 of 3)]
[im 1/1]
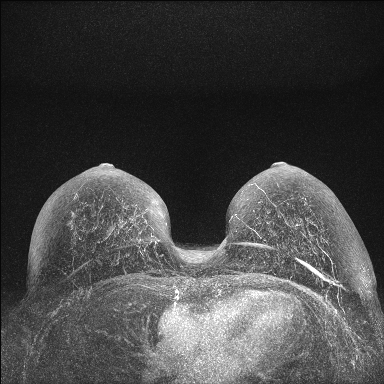

[Series 8: fl3d post-cm 3 · axial · 1.2mm · 0.94mm/px · z∈[-126,+46]mm · 6 of 144 slices shown (1 of 2)]
[im 1/144]
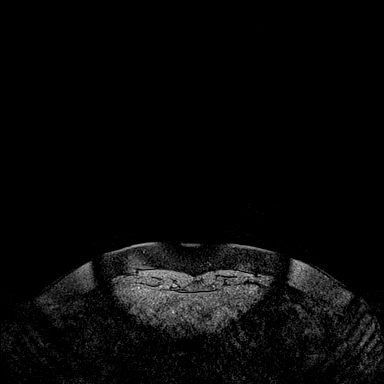
[im 29/144]
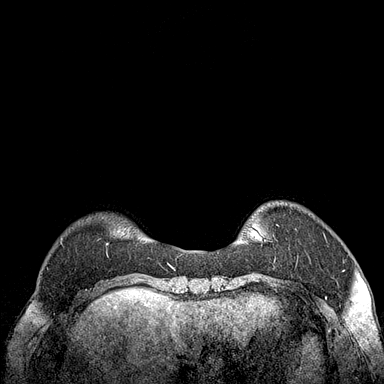
[im 58/144]
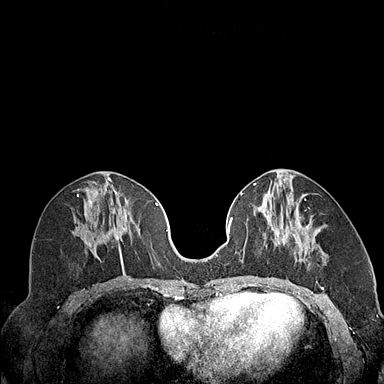
[im 86/144]
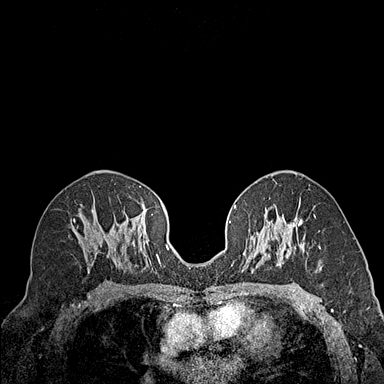
[im 115/144]
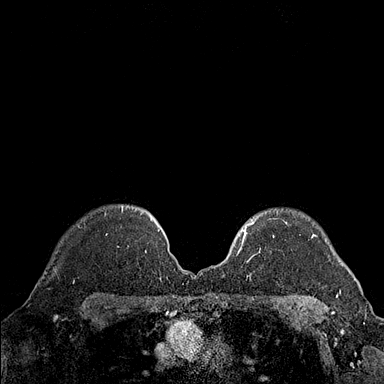
[im 144/144]
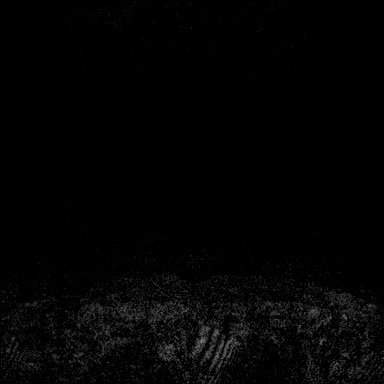

[Series 9: fl3d post-cm 3 · axial · 1.2mm · 0.94mm/px · z∈[-126,-24]mm · 4 of 144 slices shown (2 of 2)]
[im 1/144]
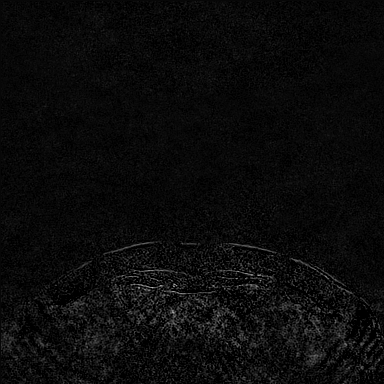
[im 29/144]
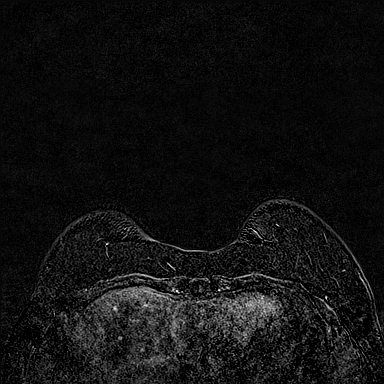
[im 58/144]
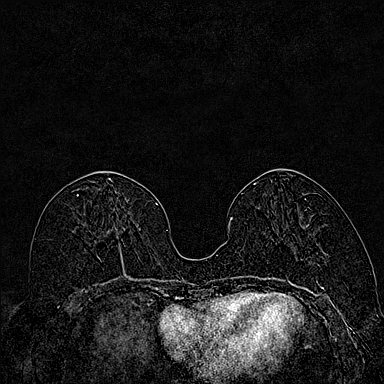
[im 86/144]
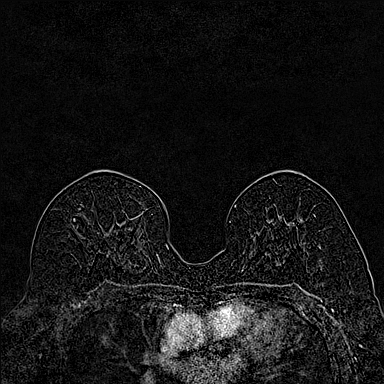

[32 of 48 positions shown; findings below may reference images not displayed]

Three-dimensional MR images were rendered by post-processing of the
original MR data on an independent workstation. The
three-dimensional MR images were interpreted, and findings are
reported in the following complete MRI report for this study. Three
dimensional images were evaluated at the independent interpreting
workstation using the DynaCAD thin client.
FINDINGS: Breast composition: c. Heterogeneous fibroglandular tissue.

Background parenchymal enhancement: Moderate.

Right breast: No mass or abnormal enhancement.

Left breast: No mass or abnormal enhancement. Signal void artifact
in the upper-outer quadrant of the left breast is seen from a biopsy
clip.

Lymph nodes: No abnormal appearing lymph nodes.

Ancillary findings:  None.
IMPRESSION: No abnormal enhancement in either breast.

RECOMMENDATION:
Bilateral diagnostic mammogram in 1 year is recommended if the
patient does not have excisional biopsy of the biopsy proven
atypical lobular hyperplasia to show stability for 2 years.

The [employer] recommends annual MRI and mammography in
patients with an estimated lifetime risk of developing breast cancer
greater than 20 - 25%, or who are known or suspected to be positive
for the breast cancer gene.

BI-RADS CATEGORY  1: Negative.

## 2022-09-13 ENCOUNTER — Other Ambulatory Visit: Payer: Self-pay | Admitting: Surgery

## 2022-09-13 DIAGNOSIS — N6099 Unspecified benign mammary dysplasia of unspecified breast: Secondary | ICD-10-CM

## 2022-09-13 DIAGNOSIS — Z1239 Encounter for other screening for malignant neoplasm of breast: Secondary | ICD-10-CM

## 2022-10-21 ENCOUNTER — Ambulatory Visit
Admission: RE | Admit: 2022-10-21 | Discharge: 2022-10-21 | Disposition: A | Discharge status: Home / Self Care | Payer: No Typology Code available for payment source | Source: Ambulatory Visit | Attending: Surgery | Admitting: Surgery

## 2022-10-21 DIAGNOSIS — N6099 Unspecified benign mammary dysplasia of unspecified breast: Secondary | ICD-10-CM

## 2023-10-11 ENCOUNTER — Other Ambulatory Visit: Payer: Self-pay | Admitting: Surgery

## 2023-10-11 DIAGNOSIS — Z9189 Other specified personal risk factors, not elsewhere classified: Secondary | ICD-10-CM

## 2023-11-14 ENCOUNTER — Ambulatory Visit
Admission: RE | Admit: 2023-11-14 | Discharge: 2023-11-14 | Disposition: A | Discharge status: Home / Self Care | Source: Ambulatory Visit | Attending: Surgery | Admitting: Surgery

## 2023-11-14 DIAGNOSIS — Z9189 Other specified personal risk factors, not elsewhere classified: Secondary | ICD-10-CM

## 2023-11-14 MED ORDER — IOPAMIDOL (ISOVUE-370) INJECTION 76%
100.0000 mL | Freq: Once | INTRAVENOUS | Status: AC | PRN
  Administered 2023-11-14: 100 mL via INTRAVENOUS

## 2024-05-26 ENCOUNTER — Emergency Department (HOSPITAL_COMMUNITY)
Admission: EM | Admit: 2024-05-26 | Discharge: 2024-05-26 | Disposition: A | Discharge status: Home / Self Care | Attending: Emergency Medicine | Admitting: Emergency Medicine

## 2024-05-26 ENCOUNTER — Emergency Department (HOSPITAL_COMMUNITY)

## 2024-05-26 ENCOUNTER — Other Ambulatory Visit: Payer: Self-pay

## 2024-05-26 DIAGNOSIS — R1013 Epigastric pain: Secondary | ICD-10-CM | POA: Diagnosis not present

## 2024-05-26 DIAGNOSIS — R0789 Other chest pain: Secondary | ICD-10-CM | POA: Insufficient documentation

## 2024-05-26 DIAGNOSIS — M542 Cervicalgia: Secondary | ICD-10-CM | POA: Insufficient documentation

## 2024-05-26 DIAGNOSIS — Y92410 Unspecified street and highway as the place of occurrence of the external cause: Secondary | ICD-10-CM | POA: Diagnosis not present

## 2024-05-26 LAB — I-STAT CHEM 8, ED
BUN: 16 mg/dL (ref 6–20)
Calcium, Ion: 1.19 mmol/L (ref 1.15–1.40)
Chloride: 108 mmol/L (ref 98–111)
Creatinine, Ser: 1.1 mg/dL — ABNORMAL HIGH (ref 0.44–1.00)
Glucose, Bld: 111 mg/dL — ABNORMAL HIGH (ref 70–99)
HCT: 41 % (ref 36.0–46.0)
Hemoglobin: 13.9 g/dL (ref 12.0–15.0)
Potassium: 3.9 mmol/L (ref 3.5–5.1)
Sodium: 141 mmol/L (ref 135–145)
TCO2: 24 mmol/L (ref 22–32)

## 2024-05-26 LAB — CBC WITH DIFFERENTIAL/PLATELET
Abs Immature Granulocytes: 0.02 K/uL (ref 0.00–0.07)
Basophils Absolute: 0 K/uL (ref 0.0–0.1)
Basophils Relative: 0 %
Eosinophils Absolute: 0.1 K/uL (ref 0.0–0.5)
Eosinophils Relative: 1 %
HCT: 39.5 % (ref 36.0–46.0)
Hemoglobin: 13.3 g/dL (ref 12.0–15.0)
Immature Granulocytes: 0 %
Lymphocytes Relative: 36 %
Lymphs Abs: 2.2 K/uL (ref 0.7–4.0)
MCH: 30.7 pg (ref 26.0–34.0)
MCHC: 33.7 g/dL (ref 30.0–36.0)
MCV: 91.2 fL (ref 80.0–100.0)
Monocytes Absolute: 0.3 K/uL (ref 0.1–1.0)
Monocytes Relative: 4 %
Neutro Abs: 3.6 K/uL (ref 1.7–7.7)
Neutrophils Relative %: 59 %
Platelets: 335 K/uL (ref 150–400)
RBC: 4.33 MIL/uL (ref 3.87–5.11)
RDW: 12.9 % (ref 11.5–15.5)
WBC: 6.1 K/uL (ref 4.0–10.5)
nRBC: 0 % (ref 0.0–0.2)

## 2024-05-26 LAB — BASIC METABOLIC PANEL WITH GFR
Anion gap: 14 (ref 5–15)
BUN: 15 mg/dL (ref 6–20)
CO2: 21 mmol/L — ABNORMAL LOW (ref 22–32)
Calcium: 9.6 mg/dL (ref 8.9–10.3)
Chloride: 105 mmol/L (ref 98–111)
Creatinine, Ser: 1.05 mg/dL — ABNORMAL HIGH (ref 0.44–1.00)
GFR, Estimated: >60 mL/min (ref >60)
Glucose, Bld: 89 mg/dL (ref 70–99)
Potassium: 4.3 mmol/L (ref 3.5–5.1)
Sodium: 140 mmol/L (ref 135–145)

## 2024-05-26 MED ORDER — OXYCODONE HCL 5 MG PO TABS: 5.0000 mg | ORAL_TABLET | ORAL | 0 refills | Status: DC | PRN

## 2024-05-26 MED ORDER — ONDANSETRON HCL 4 MG/2ML IJ SOLN
4.0000 mg | Freq: Once | INTRAMUSCULAR | Status: AC
  Administered 2024-05-26: 4 mg via INTRAVENOUS
  Filled 2024-05-26: qty 2

## 2024-05-26 MED ORDER — OXYCODONE HCL 5 MG PO TABS
5.0000 mg | ORAL_TABLET | ORAL | 0 refills | Status: AC | PRN
Start: 2024-05-26 — End: ?

## 2024-05-26 MED ORDER — TRAMADOL HCL 50 MG PO TABS: 50.0000 mg | ORAL_TABLET | Freq: Four times a day (QID) | ORAL | 0 refills | Status: AC | PRN

## 2024-05-26 MED ORDER — TRAMADOL HCL 50 MG PO TABS: 50.0000 mg | ORAL_TABLET | Freq: Four times a day (QID) | ORAL | 0 refills | Status: DC | PRN

## 2024-05-26 MED ORDER — DOCUSATE SODIUM 100 MG PO CAPS: 100.0000 mg | ORAL_CAPSULE | Freq: Two times a day (BID) | ORAL | 0 refills | Status: AC

## 2024-05-26 MED ORDER — TRAMADOL HCL 50 MG PO TABS
50.0000 mg | ORAL_TABLET | Freq: Once | ORAL | Status: AC
Start: 2024-05-26 — End: 2024-05-26
  Administered 2024-05-26: 50 mg via ORAL
  Filled 2024-05-26: qty 1

## 2024-05-26 MED ORDER — IOHEXOL 350 MG/ML SOLN
75.0000 mL | Freq: Once | INTRAVENOUS | Status: AC | PRN
  Administered 2024-05-26: 75 mL via INTRAVENOUS

## 2024-05-26 MED ORDER — MORPHINE SULFATE (PF) 4 MG/ML IV SOLN
4.0000 mg | Freq: Once | INTRAVENOUS | Status: AC
  Administered 2024-05-26: 4 mg via INTRAVENOUS
  Filled 2024-05-26: qty 1

## 2024-05-26 MED ORDER — DOCUSATE SODIUM 100 MG PO CAPS: 100.0000 mg | ORAL_CAPSULE | Freq: Two times a day (BID) | ORAL | 0 refills | Status: DC
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
## 2024-05-26 NOTE — ED Notes (Signed)
 Pt ambulated to restroom.

## 2024-05-26 NOTE — ED Notes (Signed)
 Pt back from CT. Alert, NAD, calm, interactive.

## 2024-05-26 NOTE — ED Notes (Signed)
 X-ray at bedside.
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
## 2024-05-26 NOTE — Discharge Instructions (Signed)
 Return for any problem.  ?

## 2024-05-26 NOTE — ED Notes (Signed)
 EDP at [facility]

## 2024-05-26 NOTE — ED Notes (Signed)
 Patient transported to CT
